# Patient Record
Sex: Female | Born: 1955
Health system: Southern US, Community
[De-identification: ages and names within clinical notes are randomized; demographics above are authoritative.]

## PROBLEM LIST (undated history)

## (undated) DIAGNOSIS — K5792 Diverticulitis of intestine, part unspecified, without perforation or abscess without bleeding: Secondary | ICD-10-CM

## (undated) HISTORY — PX: DILATION AND CURETTAGE OF UTERUS: SHX78

## (undated) HISTORY — DX: Diverticulitis of intestine, part unspecified, without perforation or abscess without bleeding: K57.92

---

## 2004-01-21 ENCOUNTER — Ambulatory Visit: Payer: Self-pay | Admitting: Unknown Physician Specialty

## 2004-02-19 ENCOUNTER — Ambulatory Visit: Payer: Self-pay | Admitting: Unknown Physician Specialty

## 2005-05-23 ENCOUNTER — Encounter: Payer: Self-pay | Admitting: Family Medicine

## 2006-06-07 ENCOUNTER — Ambulatory Visit: Payer: Self-pay | Admitting: Unknown Physician Specialty

## 2007-01-24 HISTORY — PX: OTHER SURGICAL HISTORY: SHX169

## 2007-07-16 ENCOUNTER — Ambulatory Visit: Payer: Self-pay | Admitting: Unknown Physician Specialty

## 2007-09-23 ENCOUNTER — Ambulatory Visit: Payer: Self-pay | Admitting: Unknown Physician Specialty

## 2010-03-07 ENCOUNTER — Ambulatory Visit: Payer: Self-pay

## 2010-03-09 ENCOUNTER — Ambulatory Visit: Payer: Self-pay

## 2012-02-26 ENCOUNTER — Other Ambulatory Visit: Payer: Self-pay | Admitting: Physician Assistant

## 2012-02-26 LAB — SEDIMENTATION RATE: Erythrocyte Sed Rate: 5 mm/h

## 2013-02-20 LAB — HM PAP SMEAR: HM Pap smear: NEGATIVE

## 2013-02-24 ENCOUNTER — Ambulatory Visit: Payer: Self-pay | Admitting: Obstetrics and Gynecology

## 2013-02-24 LAB — HM MAMMOGRAPHY

## 2015-01-07 ENCOUNTER — Ambulatory Visit: Payer: Self-pay | Admitting: Physician Assistant

## 2015-01-07 ENCOUNTER — Encounter: Payer: Self-pay | Admitting: Physician Assistant

## 2015-01-07 VITALS — BP 142/90 | HR 60 | Temp 98.1°F

## 2015-01-07 DIAGNOSIS — M25572 Pain in left ankle and joints of left foot: Secondary | ICD-10-CM

## 2015-01-07 NOTE — Progress Notes (Signed)
S: c/o left ankle pain and swelling, no known injury, pain increased with pointing and flexing to toe, swelling is to the back of the ankle, gets worse at the end of the day, noticed after she was riding a bike, sx for 2 months  O: vitals wnl, nad, left ankle neg for bony tenderness, no swelling noted, area behind ankle and adjacent to achilles tendon tender to palp, full rom, n/v intact  A: ankle pain  P: f/u with podiatry, use ice, nsaids, rest and elevation

## 2015-04-27 DIAGNOSIS — Z1239 Encounter for other screening for malignant neoplasm of breast: Secondary | ICD-10-CM | POA: Diagnosis not present

## 2015-04-27 DIAGNOSIS — Z01419 Encounter for gynecological examination (general) (routine) without abnormal findings: Secondary | ICD-10-CM | POA: Diagnosis not present

## 2015-12-06 ENCOUNTER — Ambulatory Visit: Payer: Self-pay | Admitting: Physician Assistant

## 2015-12-06 VITALS — BP 129/80 | HR 68 | Temp 98.0°F

## 2015-12-06 DIAGNOSIS — J029 Acute pharyngitis, unspecified: Secondary | ICD-10-CM

## 2015-12-06 NOTE — Progress Notes (Signed)
S/ ST, hurts to swallow x 5 days, without other acute sxs with ROS .   Nose closes up at night ,neg hx of allergies . + snores, wakes self up without day time fatigue  O/ VSS ENT pharynx injected without lesions ,exudate neck supple without nodes Heart rsr lungs clear A/ Sore throat   ? pnd , ? Secondary to snoring, ? Viral P / symptomatic measures encouraged.  Try otc claritin, nasal saline. And analgesics . F/u prn not improving.

## 2016-04-12 DIAGNOSIS — H52222 Regular astigmatism, left eye: Secondary | ICD-10-CM | POA: Diagnosis not present

## 2016-04-12 DIAGNOSIS — H524 Presbyopia: Secondary | ICD-10-CM | POA: Diagnosis not present

## 2016-04-12 DIAGNOSIS — H5203 Hypermetropia, bilateral: Secondary | ICD-10-CM | POA: Diagnosis not present

## 2016-05-04 ENCOUNTER — Ambulatory Visit (INDEPENDENT_AMBULATORY_CARE_PROVIDER_SITE_OTHER): Payer: 59 | Admitting: Obstetrics and Gynecology

## 2016-05-04 ENCOUNTER — Encounter: Payer: Self-pay | Admitting: Obstetrics and Gynecology

## 2016-05-04 VITALS — BP 120/80 | Ht 62.0 in | Wt 108.0 lb

## 2016-05-04 DIAGNOSIS — Z1231 Encounter for screening mammogram for malignant neoplasm of breast: Secondary | ICD-10-CM

## 2016-05-04 DIAGNOSIS — Z124 Encounter for screening for malignant neoplasm of cervix: Secondary | ICD-10-CM | POA: Diagnosis not present

## 2016-05-04 DIAGNOSIS — Z01419 Encounter for gynecological examination (general) (routine) without abnormal findings: Secondary | ICD-10-CM

## 2016-05-04 DIAGNOSIS — Z1239 Encounter for other screening for malignant neoplasm of breast: Secondary | ICD-10-CM

## 2016-05-04 DIAGNOSIS — Z0000 Encounter for general adult medical examination without abnormal findings: Secondary | ICD-10-CM

## 2016-05-04 DIAGNOSIS — Z1151 Encounter for screening for human papillomavirus (HPV): Secondary | ICD-10-CM | POA: Diagnosis not present

## 2016-05-04 DIAGNOSIS — Z13220 Encounter for screening for lipoid disorders: Secondary | ICD-10-CM

## 2016-05-04 DIAGNOSIS — Z1321 Encounter for screening for nutritional disorder: Secondary | ICD-10-CM

## 2016-05-04 NOTE — Progress Notes (Signed)
Chief Complaint  Patient presents with  . Gynecologic Exam    HPI:      Ms. Haley Cortez is a 61 y.o. G2P1011 who LMP was No LMP recorded. Patient is postmenopausal., presents today for her annual examination.  Her menses are absent. Dysmenorrhea none. She does not have intermenstrual bleeding.  She does not have vasomotor sx.  Sex activity: single partner, contraception - post menopausal status. She does have vaginal dryness. She uses lubricants with sx relief.  Last Pap: February 20, 2013  Results were: no abnormalities /neg HPV DNA.  Hx of STDs: none  Last mammogram: through work, at [facility] Results were: normal--routine follow-up in 12 months There is no FH of breast cancer. There is no FH of ovarian cancer. The patient does do self-breast exams.  Colonoscopy: colonoscopy 9 years ago without abnormalities.   Tobacco use: The patient denies current or previous tobacco use. Alcohol use: social drinker Exercise: moderately active  She does get adequate calcium and Vitamin D in her diet. She would like screening labs done. She no longer has PCP.  Past Medical History:  Diagnosis Date  . Diverticulitis     Past Surgical History:  Procedure Laterality Date  . diagnostic colonoscopy  2009   diverticulitis  . DILATION AND CURETTAGE OF UTERUS      Family History  Problem Relation Age of Onset  . Hypertension Mother     Social History   Social History  . Marital status: Married    Spouse name: N/A  . Number of children: N/A  . Years of education: N/A   Occupational History  . Not on file.   Social History Main Topics  . Smoking status: Never Smoker  . Smokeless tobacco: Never Used  . Alcohol use 0.0 oz/week  . Drug use: No  . Sexual activity: Yes   Other Topics Concern  . Not on file   Social History Narrative  . No narrative on file    No current outpatient prescriptions on file.   ROS:  Review of Systems  Constitutional: Negative for fever,  malaise/fatigue and weight loss.  HENT: Negative for congestion, ear pain and sinus pain.   Respiratory: Negative for cough, shortness of breath and wheezing.   Cardiovascular: Negative for chest pain, orthopnea and leg swelling.  Gastrointestinal: Negative for constipation, diarrhea, nausea and vomiting.  Genitourinary: Negative for dysuria, frequency, hematuria and urgency.       Breast ROS: negative   Musculoskeletal: Negative for back pain, joint pain and myalgias.  Skin: Negative for itching and rash.  Neurological: Negative for dizziness, tingling, focal weakness and headaches.  Endo/Heme/Allergies: Negative for environmental allergies. Does not bruise/bleed easily.  Psychiatric/Behavioral: Negative for depression and suicidal ideas. The patient is not nervous/anxious and does not have insomnia.     Objective: BP 120/80   Ht  (1.575 m)   Wt 108 lb (49 kg)   BMI 19.75 kg/m    Physical Exam  Constitutional: She is oriented to person, place, and time. She appears well-developed and well-nourished.  Genitourinary: Vagina normal and uterus normal. No erythema or tenderness in the vagina. No vaginal discharge found. Right adnexum does not display mass and does not display tenderness. Left adnexum does not display mass and does not display tenderness. Cervix does not exhibit motion tenderness or polyp. Uterus is not enlarged or tender.  Neck: Normal range of motion. No thyromegaly present.  Cardiovascular: Normal rate, regular rhythm and normal heart sounds.  No murmur heard. Pulmonary/Chest: Effort normal and breath sounds normal. Right breast exhibits no mass, no nipple discharge, no skin change and no tenderness. Left breast exhibits no mass, no nipple discharge, no skin change and no tenderness.  Abdominal: Soft. There is no tenderness. There is no guarding.  Musculoskeletal: Normal range of motion.  Neurological: She is alert and oriented to person, place, and time. No  cranial nerve deficit.  Psychiatric: She has a normal mood and affect. Her behavior is normal.  Vitals reviewed.    Assessment/Plan: Encounter for annual routine gynecological examination  Cervical cancer screening - Plan: IGP, Aptima HPV  Screening for HPV (human papillomavirus) - Plan: IGP, Aptima HPV  Screening for breast cancer - Plan: MM DIGITAL SCREENING BILATERAL  Blood tests for routine general physical examination - Plan: Comprehensive metabolic panel, Lipid panel, VITAMIN D 25 Hydroxy (Vit-D Deficiency, Fractures)  Screening cholesterol level - Plan: Lipid panel  Encounter for vitamin deficiency screening - Plan: VITAMIN D 25 Hydroxy (Vit-D Deficiency, Fractures)           GYN counsel mammography screening, adequate intake of calcium and vitamin D     F/U  Return in about 1 year (around 05/04/2017).  Alicia B. Copland, PA-C 05/04/2016 8:38 AM

## 2016-05-05 LAB — COMPREHENSIVE METABOLIC PANEL
ALBUMIN: 4.6 g/dL (ref 3.6–4.8)
ALK PHOS: 49 IU/L (ref 39–117)
ALT: 17 IU/L (ref 0–32)
AST: 17 IU/L (ref 0–40)
Albumin/Globulin Ratio: 1.8 (ref 1.2–2.2)
BILIRUBIN TOTAL: 0.3 mg/dL (ref 0.0–1.2)
BUN / CREAT RATIO: 19 (ref 12–28)
BUN: 13 mg/dL (ref 8–27)
CHLORIDE: 103 mmol/L (ref 96–106)
CO2: 27 mmol/L (ref 18–29)
Calcium: 9.5 mg/dL (ref 8.7–10.3)
Creatinine, Ser: 0.7 mg/dL (ref 0.57–1.00)
GFR calc Af Amer: 107 mL/min/{1.73_m2} (ref >59)
GFR calc non Af Amer: 93 mL/min/{1.73_m2} (ref >59)
GLUCOSE: 83 mg/dL (ref 65–99)
Globulin, Total: 2.6 g/dL (ref 1.5–4.5)
POTASSIUM: 4.1 mmol/L (ref 3.5–5.2)
SODIUM: 146 mmol/L — AB (ref 134–144)
Total Protein: 7.2 g/dL (ref 6.0–8.5)

## 2016-05-05 LAB — VITAMIN D 25 HYDROXY (VIT D DEFICIENCY, FRACTURES): Vit D, 25-Hydroxy: 38.7 ng/mL (ref 30.0–100.0)

## 2016-05-05 LAB — LIPID PANEL
CHOLESTEROL TOTAL: 200 mg/dL — AB (ref 100–199)
Chol/HDL Ratio: 3.1 ratio (ref 0.0–4.4)
HDL: 64 mg/dL (ref >39)
LDL Calculated: 119 mg/dL — ABNORMAL HIGH (ref 0–99)
Triglycerides: 83 mg/dL (ref 0–149)
VLDL CHOLESTEROL CAL: 17 mg/dL (ref 5–40)

## 2016-05-07 LAB — IGP, APTIMA HPV
HPV APTIMA: NEGATIVE
PAP SMEAR COMMENT: 0

## 2016-05-24 ENCOUNTER — Ambulatory Visit
Admission: RE | Admit: 2016-05-24 | Discharge: 2016-05-24 | Disposition: A | Discharge status: Home / Self Care | Payer: 59 | Source: Ambulatory Visit | Attending: Obstetrics and Gynecology | Admitting: Obstetrics and Gynecology

## 2016-05-24 ENCOUNTER — Encounter: Payer: Self-pay | Admitting: Obstetrics and Gynecology

## 2016-05-24 DIAGNOSIS — Z1231 Encounter for screening mammogram for malignant neoplasm of breast: Secondary | ICD-10-CM | POA: Diagnosis not present

## 2016-05-24 DIAGNOSIS — Z1239 Encounter for other screening for malignant neoplasm of breast: Secondary | ICD-10-CM

## 2017-06-22 ENCOUNTER — Other Ambulatory Visit: Payer: Self-pay | Admitting: Obstetrics and Gynecology

## 2017-07-18 ENCOUNTER — Encounter: Payer: Self-pay | Admitting: Obstetrics and Gynecology

## 2017-07-18 ENCOUNTER — Ambulatory Visit (INDEPENDENT_AMBULATORY_CARE_PROVIDER_SITE_OTHER): Payer: 59 | Admitting: Obstetrics and Gynecology

## 2017-07-18 VITALS — BP 130/80 | Ht 62.0 in | Wt 129.0 lb

## 2017-07-18 DIAGNOSIS — Z13220 Encounter for screening for lipoid disorders: Secondary | ICD-10-CM | POA: Diagnosis not present

## 2017-07-18 DIAGNOSIS — Z1211 Encounter for screening for malignant neoplasm of colon: Secondary | ICD-10-CM

## 2017-07-18 DIAGNOSIS — Z1231 Encounter for screening mammogram for malignant neoplasm of breast: Secondary | ICD-10-CM

## 2017-07-18 DIAGNOSIS — Z0000 Encounter for general adult medical examination without abnormal findings: Secondary | ICD-10-CM

## 2017-07-18 DIAGNOSIS — Z1239 Encounter for other screening for malignant neoplasm of breast: Secondary | ICD-10-CM

## 2017-07-18 DIAGNOSIS — Z01419 Encounter for gynecological examination (general) (routine) without abnormal findings: Secondary | ICD-10-CM | POA: Diagnosis not present

## 2017-07-18 NOTE — Progress Notes (Signed)
Chief Complaint  Patient presents with  . Gynecologic Exam     HPI:      Ms. Haley Cortez is a 62 y.o. G2P1011 who LMP was No LMP recorded. Patient is postmenopausal., presents today for her annual examination. Her menses are absent. Dysmenorrhea none. She does not have intermenstrual bleeding.  She does not have vasomotor sx.  Sex activity: single partner, contraception - post menopausal status. She does have vaginal dryness. She uses lubricants with sx relief.  Last Pap: 05/04/16  Results were: no abnormalities /neg HPV DNA.  Hx of STDs: none  Last mammogram: through work, 05/24/16. Results were: normal--routine follow-up in 12 months There is no FH of breast cancer. There is no FH of ovarian cancer. The patient does do self-breast exams.  Colonoscopy: colonoscopy 10 years ago with diverticulosis; repeat due after 10 yrs. Pt thinks it was with Dr. Mechele CollinElliott.   Tobacco use: The patient denies current or previous tobacco use. Alcohol use: social drinker Exercise: moderately active  She does get adequate calcium and Vitamin D in her diet.  Had borderline lipids 2018 labs. Due for repeat today.   Past Medical History:  Diagnosis Date  . Diverticulitis     Past Surgical History:  Procedure Laterality Date  . diagnostic colonoscopy  2009   diverticulitis  . DILATION AND CURETTAGE OF UTERUS      Family History  Problem Relation Age of Onset  . Hypertension Mother   . Breast cancer Neg Hx     Social History   Socioeconomic History  . Marital status: Married    Spouse name: Not on file  . Number of children: Not on file  . Years of education: Not on file  . Highest education level: Not on file  Occupational History  . Not on file  Social Needs  . Financial resource strain: Not on file  . Food insecurity:    Worry: Not on file    Inability: Not on file  . Transportation needs:    Medical: Not on file    Non-medical: Not on file  Tobacco Use  . Smoking  status: Never Smoker  . Smokeless tobacco: Never Used  Substance and Sexual Activity  . Alcohol use: Yes    Alcohol/week: 0.0 oz  . Drug use: No  . Sexual activity: Yes  Lifestyle  . Physical activity:    Days per week: Not on file    Minutes per session: Not on file  . Stress: Not on file  Relationships  . Social connections:    Talks on phone: Not on file    Gets together: Not on file    Attends religious service: Not on file    Active member of club or organization: Not on file    Attends meetings of clubs or organizations: Not on file    Relationship status: Not on file  . Intimate partner violence:    Fear of current or ex partner: Not on file    Emotionally abused: Not on file    Physically abused: Not on file    Forced sexual activity: Not on file  Other Topics Concern  . Not on file  Social History Narrative  . Not on file    No current outpatient medications on file prior to visit.   No current facility-administered medications on file prior to visit.       ROS:  Review of Systems  Constitutional: Negative for fatigue, fever and unexpected weight  change.  Respiratory: Negative for cough, shortness of breath and wheezing.   Cardiovascular: Negative for chest pain, palpitations and leg swelling.  Gastrointestinal: Negative for blood in stool, constipation, diarrhea, nausea and vomiting.  Endocrine: Negative for cold intolerance, heat intolerance and polyuria.  Genitourinary: Negative for dyspareunia, dysuria, flank pain, frequency, genital sores, hematuria, menstrual problem, pelvic pain, urgency, vaginal bleeding, vaginal discharge and vaginal pain.  Musculoskeletal: Negative for back pain, joint swelling and myalgias.  Skin: Negative for rash.  Neurological: Negative for dizziness, syncope, light-headedness, numbness and headaches.  Hematological: Negative for adenopathy.  Psychiatric/Behavioral: Negative for agitation, confusion, sleep disturbance and  suicidal ideas. The patient is not nervous/anxious.      Objective: BP 130/80   Ht 5\' 2"  (1.575 m)   Wt 129 lb (58.5 kg)   BMI 23.59 kg/m    Physical Exam  Constitutional: She is oriented to person, place, and time. She appears well-developed and well-nourished.  Genitourinary: Vagina normal and uterus normal. There is no rash or tenderness on the right labia. There is no rash or tenderness on the left labia. No erythema or tenderness in the vagina. No vaginal discharge found. Right adnexum does not display mass and does not display tenderness. Left adnexum does not display mass and does not display tenderness. Cervix does not exhibit motion tenderness or polyp. Uterus is not enlarged or tender.  Neck: Normal range of motion. No thyromegaly present.  Cardiovascular: Normal rate, regular rhythm and normal heart sounds.  No murmur heard. Pulmonary/Chest: Effort normal and breath sounds normal. Right breast exhibits no mass, no nipple discharge, no skin change and no tenderness. Left breast exhibits no mass, no nipple discharge, no skin change and no tenderness.  Abdominal: Soft. There is no tenderness. There is no guarding.  Musculoskeletal: Normal range of motion.  Neurological: She is alert and oriented to person, place, and time. No cranial nerve deficit.  Psychiatric: She has a normal mood and affect. Her behavior is normal.  Vitals reviewed.   Assessment/Plan: Encounter for annual routine gynecological examination  Screening for breast cancer - Pt to sched mammo - Plan: MM DIGITAL SCREENING BILATERAL  Screening for colon cancer - Pt to f/u with [facility] to sched colonoscopy. Will do ref prn.  Blood tests for routine general physical examination - Plan: Comprehensive metabolic panel, Lipid panel  Screening cholesterol level - Plan: Lipid panel          GYN counsel breast self exam, mammography screening, menopause, adequate intake of calcium and vitamin D, diet and exercise      F/U  Return in about 1 year (around 07/19/2018).  Vincent Ehrler B. Tahiry Spicer, PA-C 07/18/2017 8:28 AM

## 2017-07-18 NOTE — Patient Instructions (Signed)
I value your feedback and entrusting us with your care. If you get a [facility] patient survey, I would appreciate you taking the time to let us know about your experience today. Thank you! 

## 2017-07-19 LAB — COMPREHENSIVE METABOLIC PANEL
A/G RATIO: 2.1 (ref 1.2–2.2)
ALT: 12 IU/L (ref 0–32)
AST: 17 IU/L (ref 0–40)
Albumin: 4.5 g/dL (ref 3.6–4.8)
Alkaline Phosphatase: 46 IU/L (ref 39–117)
BILIRUBIN TOTAL: 0.3 mg/dL (ref 0.0–1.2)
BUN/Creatinine Ratio: 18 (ref 12–28)
BUN: 14 mg/dL (ref 8–27)
CO2: 24 mmol/L (ref 20–29)
Calcium: 9.6 mg/dL (ref 8.7–10.3)
Chloride: 104 mmol/L (ref 96–106)
Creatinine, Ser: 0.79 mg/dL (ref 0.57–1.00)
GFR calc non Af Amer: 79 mL/min/{1.73_m2} (ref >59)
GFR, EST AFRICAN AMERICAN: 91 mL/min/{1.73_m2} (ref >59)
Globulin, Total: 2.1 g/dL (ref 1.5–4.5)
Glucose: 84 mg/dL (ref 65–99)
POTASSIUM: 4.3 mmol/L (ref 3.5–5.2)
SODIUM: 142 mmol/L (ref 134–144)
TOTAL PROTEIN: 6.6 g/dL (ref 6.0–8.5)

## 2017-07-19 LAB — LIPID PANEL
CHOL/HDL RATIO: 2.5 ratio (ref 0.0–4.4)
CHOLESTEROL TOTAL: 195 mg/dL (ref 100–199)
HDL: 77 mg/dL (ref >39)
LDL Calculated: 104 mg/dL — ABNORMAL HIGH (ref 0–99)
Triglycerides: 70 mg/dL (ref 0–149)
VLDL Cholesterol Cal: 14 mg/dL (ref 5–40)

## 2017-08-06 ENCOUNTER — Encounter: Payer: Self-pay | Admitting: Obstetrics and Gynecology

## 2017-08-06 ENCOUNTER — Ambulatory Visit
Admission: RE | Admit: 2017-08-06 | Discharge: 2017-08-06 | Disposition: A | Discharge status: Home / Self Care | Payer: 59 | Source: Ambulatory Visit | Attending: Obstetrics and Gynecology | Admitting: Obstetrics and Gynecology

## 2017-08-06 DIAGNOSIS — Z1231 Encounter for screening mammogram for malignant neoplasm of breast: Secondary | ICD-10-CM | POA: Insufficient documentation

## 2017-08-06 DIAGNOSIS — Z1239 Encounter for other screening for malignant neoplasm of breast: Secondary | ICD-10-CM

## 2017-09-11 DIAGNOSIS — Z1211 Encounter for screening for malignant neoplasm of colon: Secondary | ICD-10-CM | POA: Diagnosis not present

## 2017-11-29 ENCOUNTER — Encounter: Payer: Self-pay | Admitting: Emergency Medicine

## 2017-11-30 ENCOUNTER — Ambulatory Visit
Admission: RE | Admit: 2017-11-30 | Discharge: 2017-11-30 | Disposition: A | Discharge status: Home / Self Care | Payer: 59 | Source: Ambulatory Visit | Attending: Unknown Physician Specialty | Admitting: Unknown Physician Specialty

## 2017-11-30 ENCOUNTER — Encounter
Admission: RE | Disposition: A | Discharge status: Home / Self Care | Payer: Self-pay | Source: Ambulatory Visit | Attending: Unknown Physician Specialty

## 2017-11-30 ENCOUNTER — Ambulatory Visit: Payer: 59 | Admitting: Anesthesiology

## 2017-11-30 ENCOUNTER — Encounter: Payer: Self-pay | Admitting: Anesthesiology

## 2017-11-30 DIAGNOSIS — K640 First degree hemorrhoids: Secondary | ICD-10-CM | POA: Diagnosis not present

## 2017-11-30 DIAGNOSIS — K5790 Diverticulosis of intestine, part unspecified, without perforation or abscess without bleeding: Secondary | ICD-10-CM | POA: Diagnosis not present

## 2017-11-30 DIAGNOSIS — Z8249 Family history of ischemic heart disease and other diseases of the circulatory system: Secondary | ICD-10-CM | POA: Insufficient documentation

## 2017-11-30 DIAGNOSIS — Z1211 Encounter for screening for malignant neoplasm of colon: Secondary | ICD-10-CM | POA: Diagnosis not present

## 2017-11-30 DIAGNOSIS — K648 Other hemorrhoids: Secondary | ICD-10-CM | POA: Diagnosis not present

## 2017-11-30 DIAGNOSIS — Z79899 Other long term (current) drug therapy: Secondary | ICD-10-CM | POA: Insufficient documentation

## 2017-11-30 DIAGNOSIS — K5730 Diverticulosis of large intestine without perforation or abscess without bleeding: Secondary | ICD-10-CM | POA: Insufficient documentation

## 2017-11-30 HISTORY — PX: COLONOSCOPY WITH PROPOFOL: SHX5780

## 2017-11-30 SURGERY — COLONOSCOPY WITH PROPOFOL
Anesthesia: General

## 2017-11-30 MED ORDER — FENTANYL CITRATE (PF) 100 MCG/2ML IJ SOLN
INTRAMUSCULAR | Status: DC | PRN
  Administered 2017-11-30 (×2): 50 ug via INTRAVENOUS

## 2017-11-30 MED ORDER — PROPOFOL 500 MG/50ML IV EMUL
INTRAVENOUS | Status: DC | PRN
  Administered 2017-11-30: 50 ug/kg/min via INTRAVENOUS

## 2017-11-30 MED ORDER — FENTANYL CITRATE (PF) 100 MCG/2ML IJ SOLN
INTRAMUSCULAR | Status: AC
  Filled 2017-11-30: qty 2

## 2017-11-30 MED ORDER — PROPOFOL 10 MG/ML IV BOLUS
INTRAVENOUS | Status: DC | PRN
  Administered 2017-11-30: 20 mg via INTRAVENOUS
  Administered 2017-11-30: 30 mg via INTRAVENOUS

## 2017-11-30 MED ORDER — SODIUM CHLORIDE 0.9 % IV SOLN: INTRAVENOUS | Status: DC

## 2017-11-30 MED ORDER — MIDAZOLAM HCL 5 MG/5ML IJ SOLN
INTRAMUSCULAR | Status: DC | PRN
  Administered 2017-11-30: 2 mg via INTRAVENOUS

## 2017-11-30 MED ORDER — LIDOCAINE HCL (PF) 2 % IJ SOLN
INTRAMUSCULAR | Status: DC | PRN
  Administered 2017-11-30: 60 mg

## 2017-11-30 MED ORDER — LIDOCAINE HCL (PF) 2 % IJ SOLN
INTRAMUSCULAR | Status: AC
  Filled 2017-11-30: qty 10

## 2017-11-30 MED ORDER — MIDAZOLAM HCL 2 MG/2ML IJ SOLN
INTRAMUSCULAR | Status: AC
  Filled 2017-11-30: qty 2

## 2017-11-30 MED ORDER — SODIUM CHLORIDE 0.9 % IV SOLN
INTRAVENOUS | Status: DC
  Administered 2017-11-30: 1000 mL via INTRAVENOUS

## 2017-11-30 NOTE — Op Note (Signed)
[facility] Gastroenterology Patient Name: Madeeha Costantino Procedure Date: 11/30/2017 8:42 AM MRN: [id number] Account #: [id number] Date of Birth: 1955/11/18 Admit Type: Outpatient Age: 62 Room: [facility] ENDO ROOM 1 Gender: Female Note Status: Finalized Procedure:            Colonoscopy Indications:          Screening for colorectal malignant neoplasm Providers:            Scot Jun, MD Referring MD:         No Local Md, MD (Referring MD) Medicines:            Propofol per Anesthesia Complications:        No immediate complications. Procedure:            Pre-Anesthesia Assessment:                       - After reviewing the risks and benefits, the patient                        was deemed in satisfactory condition to undergo the                        procedure.                       After obtaining informed consent, the colonoscope was                        passed under direct vision. Throughout the procedure,                        the patient's blood pressure, pulse, and oxygen                        saturations were monitored continuously. The                        Colonoscope was introduced through the anus and                        advanced to the the cecum, identified by appendiceal                        orifice and ileocecal valve. The colonoscopy was                        performed without difficulty. The patient tolerated the                        procedure well. The quality of the bowel preparation                        was excellent. Findings:      Multiple medium-mouthed diverticula were found in the sigmoid colon,       descending colon and transverse colon.      Internal hemorrhoids were found during endoscopy. The hemorrhoids were       small and Grade I (internal hemorrhoids that do not prolapse).      The exam was otherwise without abnormality. No polyps seen. Impression:           -  Diverticulosis in the sigmoid colon, in the              descending colon and in the transverse colon.                       - Internal hemorrhoids.                       - The examination was otherwise normal.                       - No specimens collected. Recommendation:       - Await pathology results. Scot Jun, MD 11/30/2017 9:19:45 AM This report has been signed electronically. Number of Addenda: 0 Note Initiated On: 11/30/2017 8:42 AM Scope Withdrawal Time: 0 hours 7 minutes 39 seconds  Total Procedure Duration: 0 hours 15 minutes 57 seconds       [facility]

## 2017-11-30 NOTE — Anesthesia Post-op Follow-up Note (Signed)
Anesthesia QCDR form completed.        

## 2017-11-30 NOTE — H&P (Signed)
Primary Care Physician:  Patient, No Pcp Per Primary Gastroenterologist:  Dr. Mechele Collin  Pre-Procedure History & Physical: HPI:  Haley Cortez is a 62 y.o. female is here for an colonoscopy.   Past Medical History:  Diagnosis Date  . Diverticulitis     Past Surgical History:  Procedure Laterality Date  . diagnostic colonoscopy  2009   diverticulitis  . DILATION AND CURETTAGE OF UTERUS      Prior to Admission medications   Medication Sig Start Date End Date Taking? Authorizing Provider  Calcium Carbonate-Vitamin D (CALTRATE 600+D PO) Take by mouth.   Yes [provider]    Allergies as of 09/11/2017  . (No Known Allergies)    Family History  Problem Relation Age of Onset  . Hypertension Mother   . Breast cancer Neg Hx     Social History   Socioeconomic History  . Marital status: Married    Spouse name: Not on file  . Number of children: Not on file  . Years of education: Not on file  . Highest education level: Not on file  Occupational History  . Not on file  Social Needs  . Financial resource strain: Not on file  . Food insecurity:    Worry: Not on file    Inability: Not on file  . Transportation needs:    Medical: Not on file    Non-medical: Not on file  Tobacco Use  . Smoking status: Never Smoker  . Smokeless tobacco: Never Used  Substance and Sexual Activity  . Alcohol use: Yes    Alcohol/week: 0.0 standard drinks  . Drug use: No  . Sexual activity: Yes  Lifestyle  . Physical activity:    Days per week: Not on file    Minutes per session: Not on file  . Stress: Not on file  Relationships  . Social connections:    Talks on phone: Not on file    Gets together: Not on file    Attends religious service: Not on file    Active member of club or organization: Not on file    Attends meetings of clubs or organizations: Not on file    Relationship status: Not on file  . Intimate partner violence:    Fear of current or ex partner: Not on file     Emotionally abused: Not on file    Physically abused: Not on file    Forced sexual activity: Not on file  Other Topics Concern  . Not on file  Social History Narrative  . Not on file    Review of Systems: See HPI, otherwise negative ROS  Physical Exam: BP (!) 178/101   Pulse 72   Temp (!) 97.4 F (36.3 C) (Tympanic)   Resp 20   Ht 5\' 2"  (1.575 m)   Wt 59 kg   SpO2 100%   BMI 23.78 kg/m  General:   Alert,  pleasant and cooperative in NAD Head:  Normocephalic and atraumatic. Neck:  Supple; no masses or thyromegaly. Lungs:  Clear throughout to auscultation.    Heart:  Regular rate and rhythm. Abdomen:  Soft, nontender and nondistended. Normal bowel sounds, without guarding, and without rebound.   Neurologic:  Alert and  oriented x4;  grossly normal neurologically.  Impression/Plan: Haley Cortez is here for an colonoscopy to be performed for colon cancer screening.  Last colon exam was 10 years ago.  Risks, benefits, limitations, and alternatives regarding  colonoscopy have been reviewed with the  patient.  Questions have been answered.  All parties agreeable.   Lynnae Prude, MD  11/30/2017, 8:53 AM

## 2017-11-30 NOTE — Anesthesia Preprocedure Evaluation (Signed)
Anesthesia Evaluation  Patient identified by MRN, date of birth, ID band Patient awake    Reviewed: Allergy & Precautions, NPO status , Patient's Chart, lab work & pertinent test results, reviewed documented beta blocker date and time   Airway Mallampati: II  TM Distance: >3 FB     Dental  (+) Chipped   Pulmonary           Cardiovascular      Neuro/Psych    GI/Hepatic   Endo/Other    Renal/GU      Musculoskeletal   Abdominal   Peds  Hematology   Anesthesia Other Findings   Reproductive/Obstetrics                             Anesthesia Physical Anesthesia Plan  ASA: II  Anesthesia Plan: General   Post-op Pain Management:    Induction: Intravenous  PONV Risk Score and Plan:   Airway Management Planned:   Additional Equipment:   Intra-op Plan:   Post-operative Plan:   Informed Consent: I have reviewed the patients History and Physical, chart, labs and discussed the procedure including the risks, benefits and alternatives for the proposed anesthesia with the patient or authorized representative who has indicated his/her understanding and acceptance.     Plan Discussed with: CRNA  Anesthesia Plan Comments:         Anesthesia Quick Evaluation  

## 2017-11-30 NOTE — Transfer of Care (Signed)
Immediate Anesthesia Transfer of Care Note  Patient: Haley Cortez  Procedure(s) Performed: COLONOSCOPY WITH PROPOFOL (N/A )  Patient Location: PACU  Anesthesia Type:General  Level of Consciousness: sedated  Airway & Oxygen Therapy: Patient Spontanous Breathing and Patient connected to nasal cannula oxygen  Post-op Assessment: Report given to RN and Post -op Vital signs reviewed and stable  Post vital signs: Reviewed and stable  Last Vitals:  Vitals Value Taken Time  BP 110/62 11/30/2017  9:19 AM  Temp 36.2 C 11/30/2017  9:19 AM  Pulse 66 11/30/2017  9:20 AM  Resp 15 11/30/2017  9:20 AM  SpO2 100 % 11/30/2017  9:20 AM  Vitals shown include unvalidated device data.  Last Pain:  Vitals:   11/30/17 0919  TempSrc: Tympanic  PainSc: Asleep         Complications: No apparent anesthesia complications

## 2017-11-30 NOTE — Anesthesia Postprocedure Evaluation (Signed)
Anesthesia Post Note  Patient: Haley Cortez  Procedure(s) Performed: COLONOSCOPY WITH PROPOFOL (N/A )  Patient location during evaluation: Endoscopy Anesthesia Type: General Level of consciousness: awake and alert Pain management: pain level controlled Vital Signs Assessment: post-procedure vital signs reviewed and stable Respiratory status: spontaneous breathing, nonlabored ventilation, respiratory function stable and patient connected to nasal cannula oxygen Cardiovascular status: blood pressure returned to baseline and stable Postop Assessment: no apparent nausea or vomiting Anesthetic complications: no     Last Vitals:  Vitals:   11/30/17 0929 11/30/17 0949  BP: 126/69 (!) 153/79  Pulse:    Resp:    Temp:    SpO2:      Last Pain:  Vitals:   11/30/17 0949  TempSrc:   PainSc: 0-No pain                 Chadley Dziedzic S

## 2017-12-03 ENCOUNTER — Encounter: Payer: Self-pay | Admitting: Unknown Physician Specialty

## 2018-02-20 ENCOUNTER — Encounter: Payer: Self-pay | Admitting: Physician Assistant

## 2018-02-20 ENCOUNTER — Ambulatory Visit: Payer: Self-pay | Admitting: Physician Assistant

## 2018-02-20 VITALS — BP 152/100 | HR 70 | Temp 98.4°F | Resp 20 | Ht 62.0 in | Wt 136.0 lb

## 2018-02-20 DIAGNOSIS — J069 Acute upper respiratory infection, unspecified: Secondary | ICD-10-CM

## 2018-02-20 DIAGNOSIS — R030 Elevated blood-pressure reading, without diagnosis of hypertension: Secondary | ICD-10-CM

## 2018-02-20 MED ORDER — CETIRIZINE HCL 10 MG PO TABS: 10.0000 mg | ORAL_TABLET | Freq: Every day | ORAL | 0 refills | Status: DC

## 2018-02-20 MED ORDER — FLUTICASONE PROPIONATE 50 MCG/ACT NA SUSP: 2.0000 | Freq: Every day | NASAL | 0 refills | Status: DC

## 2018-02-20 MED ORDER — DM-DOXYLAMINE-ACETAMINOPHEN 15-6.25-325 MG/15ML PO LIQD: 30.0000 mL | Freq: Every evening | ORAL | 0 refills | Status: DC | PRN

## 2018-02-20 MED ORDER — SALINE SPRAY 0.65 % NA SOLN: 1.0000 | NASAL | 0 refills | Status: DC | PRN

## 2018-02-20 NOTE — Progress Notes (Signed)
MRN: [id number] DOB: 11/13/1955  Subjective:   Haley Cortez is a 63 y.o. female presenting for chief complaint of Sore Throat (x 4d) and Nasal Congestion (x 4d) .  Scratchy throat , nasal congestion, and sinus congestion x 4 days. What she was able to blow out today had a little green color to it. Has decreased taste. Has coughed a few times, but it has been dry. Feels most congested at night time. Denies fever, body aches,  sinus headache, inability to swallow, productive cough, wheezing, shortness of breath and chest pain, night sweats, nausea, vomiting, abdominal pain and diarrhea. Has not tried anything for relief. No known sick contact exposure. Has PMH of seasonal allergies. Denies hx of asthma, COPD, DM, HTN. Had flu shot this season. Denies smoking. Denies any other aggravating or relieving factors, no other questions or concerns.  Review of Systems  Constitutional: Negative for diaphoresis.  HENT: Negative for ear pain.   Eyes: Negative for blurred vision and double vision.  Respiratory: Negative for hemoptysis, sputum production and shortness of breath.   Cardiovascular: Negative for chest pain and leg swelling.  Genitourinary: Negative for hematuria.  Musculoskeletal: Negative for neck pain.  Skin: Negative for rash.  Neurological: Negative for dizziness and weakness.    Haley Cortez has a current medication list which includes the following prescription(s): calcium carbonate-vitamin d. Also has No Known Allergies.  Haley Cortez  has a past medical history of Diverticulitis. Also  has a past surgical history that includes Dilation and curettage of uterus; diagnostic colonoscopy (2009); and Colonoscopy with propofol (N/A, 11/30/2017).   Objective:   Vitals: BP (!) 140/100 (BP Location: Right Arm, Patient Position: Sitting, Cuff Size: Normal)   Pulse 70   Temp 98.4 F (36.9 C)   Resp 20   Ht 5\' 2"  (1.575 m)   Wt 136 lb (61.7 kg)   SpO2 98%   BMI 24.87 kg/m   Physical Exam Vitals signs  reviewed.  Constitutional:      General: She is not in acute distress.    Appearance: Normal appearance. She is well-developed and well-groomed. She is not ill-appearing or toxic-appearing.  HENT:     Head: Normocephalic and atraumatic.     Right Ear: Tympanic membrane, ear canal and external ear normal.     Left Ear: Tympanic membrane, ear canal and external ear normal.     Nose: Mucosal edema and congestion present.     Right Sinus: No maxillary sinus tenderness or frontal sinus tenderness.     Left Sinus: No maxillary sinus tenderness or frontal sinus tenderness.     Mouth/Throat:     Lips: Pink.     Mouth: Mucous membranes are moist.     Pharynx: Uvula midline. Posterior oropharyngeal erythema present.     Tonsils: No tonsillar exudate or tonsillar abscesses. Swelling: 1+ on the right. 1+ on the left.  Eyes:     Extraocular Movements: Extraocular movements intact.     Conjunctiva/sclera: Conjunctivae normal.     Pupils: Pupils are equal, round, and reactive to light.  Neck:     Musculoskeletal: Normal range of motion.  Cardiovascular:     Rate and Rhythm: Normal rate and regular rhythm.     Heart sounds: Normal heart sounds.  Pulmonary:     Effort: Pulmonary effort is normal.     Breath sounds: Normal breath sounds. No decreased breath sounds, wheezing, rhonchi or rales.  Lymphadenopathy:     Head:     Right side of  head: No submental, submandibular, tonsillar, preauricular, posterior auricular or occipital adenopathy.     Left side of head: No submental, submandibular, tonsillar, preauricular, posterior auricular or occipital adenopathy.     Cervical: No cervical adenopathy.     Upper Body:     Right upper body: No supraclavicular adenopathy.     Left upper body: No supraclavicular adenopathy.  Skin:    General: Skin is warm and dry.  Neurological:     Mental Status: She is alert.     No results found for this or any previous visit (from the past 24 hour(s)).  BP  Readings from Last 3 Encounters:  02/20/18 (!) 140/100  11/30/17 (!) 153/79  07/18/17 130/80    Assessment and Plan :  1. Viral URI Patient is overall well-appearing, no acute distress.  VSS. History and physical exam are consistent with viral URI.  Given educational material on viral URI.  Recommend symptomatic treatment at this time.  Advised to contact office if sinus pressure persists/worsens over the next 3 to 5 days, would consider Rx for antibiotic at that time.  Advised to follow-up in office, with family doctor, or local urgent care if no improvement in symptoms after 7 to 10 days.  Seek care sooner at local urgent care or ED if symptoms worsen/develop new concerning symptoms.  Patient voices understanding. - fluticasone (FLONASE) 50 MCG/ACT nasal spray; Place 2 sprays into both nostrils daily.  Dispense: 16 g; Refill: 0 - DM-Doxylamine-Acetaminophen (NYQUIL HBP COLD & FLU) 15-6.25-325 MG/15ML LIQD; Take 30 mLs by mouth at bedtime as needed.  Dispense: 120 mL; Refill: 0 - sodium chloride (OCEAN) 0.65 % SOLN nasal spray; Place 1 spray into both nostrils as needed for congestion.  Dispense: 60 mL; Refill: 0 - cetirizine (ZYRTEC) 10 MG tablet; Take 1 tablet (10 mg total) by mouth daily.  Dispense: 30 tablet; Refill: 0  2. Elevated BP without diagnosis of hypertension In terms of bp, she is asx.  She has been checking bp regularly at home and it has been WNL. Instructed to continue to do so over the next couple of weeks. Contact family doctor for appointment if consistently >140/90. Given strict ED precautions.    Benjiman Core, Cordelia Poche  [facility] 02/20/2018 4:36 PM

## 2018-02-20 NOTE — Patient Instructions (Signed)
Viral Respiratory Infection  Use flonase and zyrtec in the morning.  Nasal saline rinses before bedtime.  May use nyquil syrup before sleep.  Warm tea with honey and lemon for scratchy throat. If no improvement in sinus congestion in 3-5 days, please contact our office as you may need an antibiotic at that time.  If any symptoms worsen or you develop new concerning symptoms, please follow up with family doctor or local urgent care/ED.   In terms of elevated blood pressure, I would like you to check your blood pressure at least a couple times over the next week outside of the office and document these values. It is best if you check the blood pressure at different times in the day. Your goal is <140/90. If your values are consistently above this goal, please contact your family doctor. If you start to have chest pain, blurred vision, shortness of breath, severe headache, lower leg swelling, or nausea/vomiting please seek care immediately at ED.   A viral respiratory infection is an illness that affects parts of the body that are used for breathing. These include the lungs, nose, and throat. It is caused by a germ called a virus. Some examples of this kind of infection are:  A cold.  The flu (influenza).  A respiratory syncytial virus (RSV) infection. A person who gets this illness may have the following symptoms:  A stuffy or runny nose.  Yellow or green fluid in the nose.  A cough.  Sneezing.  Tiredness (fatigue).  Achy muscles.  A sore throat.  Sweating or chills.  A fever.  A headache. Follow these instructions at home: Managing pain and congestion  Take over-the-counter and prescription medicines only as told by your doctor.  If you have a sore throat, gargle with salt water. Do this 3-4 times per day or as needed. To make a salt-water mixture, dissolve -1 tsp of salt in 1 cup of warm water. Make sure that all the salt dissolves.  Use nose drops made from salt  water. This helps with stuffiness (congestion). It also helps soften the skin around your nose.  Drink enough fluid to keep your pee (urine) pale yellow. General instructions   Rest as much as possible.  Do not drink alcohol.  Do not use any products that have nicotine or tobacco, such as cigarettes and e-cigarettes. If you need help quitting, ask your doctor.  Keep all follow-up visits as told by your doctor. This is important. How is this prevented?   Get a flu shot every year. Ask your doctor when you should get your flu shot.  Do not let other people get your germs. If you are sick: ? Stay home from work or school. ? Wash your hands with soap and water often. Wash your hands after you cough or sneeze. If soap and water are not available, use hand sanitizer.  Avoid contact with people who are sick during cold and flu season. This is in fall and winter. Get help if:  Your symptoms last for 10 days or longer.  Your symptoms get worse over time.  You have a fever.  You have very bad pain in your face or forehead.  Parts of your jaw or neck become very swollen. Get help right away if:  You feel pain or pressure in your chest.  You have shortness of breath.  You faint or feel like you will faint.  You keep throwing up (vomiting).  You feel confused. Summary  A  viral respiratory infection is an illness that affects parts of the body that are used for breathing.  Examples of this illness include a cold, the flu, and respiratory syncytial virus (RSV) infection.  The infection can cause a runny nose, cough, sneezing, sore throat, and fever.  Follow what your doctor tells you about taking medicines, drinking lots of fluid, washing your hands, resting at home, and avoiding people who are sick. This information is not intended to replace advice given to you by your health care provider. Make sure you discuss any questions you have with your health care provider. Document  Released: 12/23/2007 Document Revised: 02/19/2017 Document Reviewed: 02/19/2017 Elsevier Interactive Patient Education  2019 [employer]

## 2018-02-22 ENCOUNTER — Telehealth: Payer: Self-pay | Admitting: Emergency Medicine

## 2018-02-22 NOTE — Telephone Encounter (Signed)
Left message following up on [facility] visit

## 2018-02-28 ENCOUNTER — Telehealth: Payer: Self-pay | Admitting: General Practice

## 2018-02-28 NOTE — Telephone Encounter (Signed)
[name] has referrred Haley Cortez.  Needing to verify that this person can become a pt of [employer].  Thanks, [employer]

## 2018-02-28 NOTE — Telephone Encounter (Signed)
Yes! This is okay per Antony ContrasJenni.

## 2018-03-04 DIAGNOSIS — H5203 Hypermetropia, bilateral: Secondary | ICD-10-CM | POA: Diagnosis not present

## 2018-03-04 DIAGNOSIS — H524 Presbyopia: Secondary | ICD-10-CM | POA: Diagnosis not present

## 2018-03-04 DIAGNOSIS — H52222 Regular astigmatism, left eye: Secondary | ICD-10-CM | POA: Diagnosis not present

## 2018-09-23 ENCOUNTER — Telehealth: Payer: Self-pay

## 2018-09-23 ENCOUNTER — Other Ambulatory Visit: Payer: Self-pay | Admitting: Obstetrics and Gynecology

## 2018-09-23 DIAGNOSIS — Z1239 Encounter for other screening for malignant neoplasm of breast: Secondary | ICD-10-CM

## 2018-09-23 NOTE — Telephone Encounter (Signed)
Called pt, no answer, left detailed msg

## 2018-09-23 NOTE — Telephone Encounter (Signed)
Pt has apt for AE w/[facility] on 10/02/2018. She is trying to schedule her mammogram & needs an order put in. [phone]

## 2018-09-23 NOTE — Telephone Encounter (Signed)
Pls let pt know order placed. She can call to sched.

## 2018-09-23 NOTE — Progress Notes (Signed)
3D mammo order placed per pt request

## 2018-10-01 NOTE — Progress Notes (Signed)
Chief Complaint  Patient presents with  . Gynecologic Exam     HPI:      Ms. CORDA LEIBER is a 63 y.o. G2P1011 who LMP was No LMP recorded. Patient is postmenopausal., presents today for her annual examination. Her menses are absent due to menopause. Pelvic pain none. She does not have intermenstrual bleeding.  She does not have vasomotor sx.   Sex activity: single partner, contraception - post menopausal status. She does have vaginal dryness. She uses lubricants with sx relief.  Last Pap: 05/04/16  Results were: no abnormalities /neg HPV DNA.  Hx of STDs: none  Last mammogram: through work, 08/06/17. Results were: normal--routine follow-up in 12 months. Has appt 10/20. There is no FH of breast cancer. There is no FH of ovarian cancer. The patient does do self-breast exams.  Colonoscopy: colonoscopy 11/2017 with diverticulosis in past; repeat due after 10 yrs. Dr. Mechele Collin.   Tobacco use: The patient denies current or previous tobacco use. Alcohol use: social drinker No drug use. Exercise: moderately active  She does get adequate calcium and Vitamin D in her diet.  Had borderline lipids 2018/2019 labs. Due for repeat today.   Past Medical History:  Diagnosis Date  . Diverticulitis     Past Surgical History:  Procedure Laterality Date  . COLONOSCOPY WITH PROPOFOL N/A 11/30/2017   Procedure: COLONOSCOPY WITH PROPOFOL;  Surgeon: Scot Jun, MD;  Location: [facility] ENDOSCOPY;  Service: Endoscopy;  Laterality: N/A;  . diagnostic colonoscopy  2009   diverticulitis  . DILATION AND CURETTAGE OF UTERUS      Family History  Problem Relation Age of Onset  . Hypertension Mother   . Breast cancer Neg Hx     Social History   Socioeconomic History  . Marital status: Married    Spouse name: Not on file  . Number of children: Not on file  . Years of education: Not on file  . Highest education level: Not on file  Occupational History  . Not on file  Social Needs  .  Financial resource strain: Not on file  . Food insecurity    Worry: Not on file    Inability: Not on file  . Transportation needs    Medical: Not on file    Non-medical: Not on file  Tobacco Use  . Smoking status: Never Smoker  . Smokeless tobacco: Never Used  Substance and Sexual Activity  . Alcohol use: Yes    Alcohol/week: 0.0 standard drinks  . Drug use: No  . Sexual activity: Yes    Birth control/protection: Post-menopausal  Lifestyle  . Physical activity    Days per week: Not on file    Minutes per session: Not on file  . Stress: Not on file  Relationships  . Social Musician on phone: Not on file    Gets together: Not on file    Attends religious service: Not on file    Active member of club or organization: Not on file    Attends meetings of clubs or organizations: Not on file    Relationship status: Not on file  . Intimate partner violence    Fear of current or ex partner: Not on file    Emotionally abused: Not on file    Physically abused: Not on file    Forced sexual activity: Not on file  Other Topics Concern  . Not on file  Social History Narrative  . Not on file  Current Outpatient Medications on File Prior to Visit  Medication Sig Dispense Refill  . Calcium Carbonate-Vitamin D (CALTRATE 600+D PO) Take by mouth.    . cetirizine (ZYRTEC) 10 MG tablet Take 1 tablet (10 mg total) by mouth daily. 30 tablet 0  . DM-Doxylamine-Acetaminophen (NYQUIL HBP COLD & FLU) 15-6.25-325 MG/15ML LIQD Take 30 mLs by mouth at bedtime as needed. 120 mL 0  . fluticasone (FLONASE) 50 MCG/ACT nasal spray Place 2 sprays into both nostrils daily. 16 g 0  . sodium chloride (OCEAN) 0.65 % SOLN nasal spray Place 1 spray into both nostrils as needed for congestion. 60 mL 0   No current facility-administered medications on file prior to visit.       ROS:  Review of Systems  Constitutional: Negative for fatigue, fever and unexpected weight change.  Respiratory:  Negative for cough, shortness of breath and wheezing.   Cardiovascular: Negative for chest pain, palpitations and leg swelling.  Gastrointestinal: Negative for blood in stool, constipation, diarrhea, nausea and vomiting.  Endocrine: Negative for cold intolerance, heat intolerance and polyuria.  Genitourinary: Negative for dyspareunia, dysuria, flank pain, frequency, genital sores, hematuria, menstrual problem, pelvic pain, urgency, vaginal bleeding, vaginal discharge and vaginal pain.  Musculoskeletal: Negative for back pain, joint swelling and myalgias.  Skin: Negative for rash.  Neurological: Negative for dizziness, syncope, light-headedness, numbness and headaches.  Hematological: Negative for adenopathy.  Psychiatric/Behavioral: Negative for agitation, confusion, sleep disturbance and suicidal ideas. The patient is not nervous/anxious.      Objective: BP 140/80   Ht 5\' 2"  (1.575 m)   Wt 139 lb (63 kg)   BMI 25.42 kg/m    Physical Exam Constitutional:      Appearance: She is well-developed.  Genitourinary:     Vulva, vagina, uterus, right adnexa and left adnexa normal.     No vulval lesion or tenderness noted.     No vaginal discharge, erythema or tenderness.     No cervical motion tenderness or polyp.     Uterus is not enlarged or tender.     No right or left adnexal mass present.     Right adnexa not tender.     Left adnexa not tender.  Neck:     Musculoskeletal: Normal range of motion.     Thyroid: No thyromegaly.  Cardiovascular:     Rate and Rhythm: Normal rate and regular rhythm.     Heart sounds: Normal heart sounds. No murmur.  Pulmonary:     Effort: Pulmonary effort is normal.     Breath sounds: Normal breath sounds.  Chest:     Breasts:        Right: No mass, nipple discharge, skin change or tenderness.        Left: No mass, nipple discharge, skin change or tenderness.  Abdominal:     Palpations: Abdomen is soft.     Tenderness: There is no abdominal  tenderness. There is no guarding.  Musculoskeletal: Normal range of motion.  Neurological:     General: No focal deficit present.     Mental Status: She is alert and oriented to person, place, and time.     Cranial Nerves: No cranial nerve deficit.  Skin:    General: Skin is warm and dry.  Psychiatric:        Mood and Affect: Mood normal.        Behavior: Behavior normal.        Thought Content: Thought content normal.  Judgment: Judgment normal.  Vitals signs reviewed.     Assessment/Plan: Encounter for annual routine gynecological examination  Screening for breast cancer--pt has mammo sched  Blood tests for routine general physical examination - Plan: Comprehensive metabolic panel, Lipid panel  Screening cholesterol level - Plan: Lipid panel          GYN counsel breast self exam, mammography screening, menopause, adequate intake of calcium and vitamin D, diet and exercise     F/U  Return in about 1 year (around 10/02/2019).  Alicia B. Copland, PA-C 10/02/2018 8:23 AM

## 2018-10-02 ENCOUNTER — Encounter: Payer: Self-pay | Admitting: Obstetrics and Gynecology

## 2018-10-02 ENCOUNTER — Ambulatory Visit (INDEPENDENT_AMBULATORY_CARE_PROVIDER_SITE_OTHER): Payer: 59 | Admitting: Obstetrics and Gynecology

## 2018-10-02 ENCOUNTER — Other Ambulatory Visit: Payer: Self-pay

## 2018-10-02 VITALS — BP 140/80 | Ht 62.0 in | Wt 139.0 lb

## 2018-10-02 DIAGNOSIS — Z1239 Encounter for other screening for malignant neoplasm of breast: Secondary | ICD-10-CM

## 2018-10-02 DIAGNOSIS — Z13220 Encounter for screening for lipoid disorders: Secondary | ICD-10-CM | POA: Diagnosis not present

## 2018-10-02 DIAGNOSIS — Z0000 Encounter for general adult medical examination without abnormal findings: Secondary | ICD-10-CM

## 2018-10-02 DIAGNOSIS — Z01419 Encounter for gynecological examination (general) (routine) without abnormal findings: Secondary | ICD-10-CM | POA: Diagnosis not present

## 2018-10-02 NOTE — Patient Instructions (Signed)
I value your feedback and entrusting us with your care. If you get a [facility] patient survey, I would appreciate you taking the time to let us know about your experience today. Thank you! 

## 2018-10-03 LAB — COMPREHENSIVE METABOLIC PANEL
ALT: 13 IU/L (ref 0–32)
AST: 20 IU/L (ref 0–40)
Albumin/Globulin Ratio: 2 (ref 1.2–2.2)
Albumin: 4.6 g/dL (ref 3.8–4.8)
Alkaline Phosphatase: 57 IU/L (ref 39–117)
BUN/Creatinine Ratio: 16 (ref 12–28)
BUN: 11 mg/dL (ref 8–27)
Bilirubin Total: 0.3 mg/dL (ref 0.0–1.2)
CO2: 25 mmol/L (ref 20–29)
Calcium: 9.4 mg/dL (ref 8.7–10.3)
Chloride: 104 mmol/L (ref 96–106)
Creatinine, Ser: 0.7 mg/dL (ref 0.57–1.00)
GFR calc Af Amer: 105 mL/min/{1.73_m2} (ref >59)
GFR calc non Af Amer: 91 mL/min/{1.73_m2} (ref >59)
Globulin, Total: 2.3 g/dL (ref 1.5–4.5)
Glucose: 82 mg/dL (ref 65–99)
Potassium: 4.7 mmol/L (ref 3.5–5.2)
Sodium: 141 mmol/L (ref 134–144)
Total Protein: 6.9 g/dL (ref 6.0–8.5)

## 2018-10-03 LAB — LIPID PANEL
Chol/HDL Ratio: 3 ratio (ref 0.0–4.4)
Cholesterol, Total: 216 mg/dL — ABNORMAL HIGH (ref 100–199)
HDL: 71 mg/dL (ref >39)
LDL Chol Calc (NIH): 128 mg/dL — ABNORMAL HIGH (ref 0–99)
Triglycerides: 98 mg/dL (ref 0–149)
VLDL Cholesterol Cal: 17 mg/dL (ref 5–40)

## 2018-11-05 ENCOUNTER — Ambulatory Visit
Admission: RE | Admit: 2018-11-05 | Discharge: 2018-11-05 | Disposition: A | Discharge status: Home / Self Care | Payer: 59 | Source: Ambulatory Visit | Attending: Obstetrics and Gynecology | Admitting: Obstetrics and Gynecology

## 2018-11-05 ENCOUNTER — Encounter: Payer: Self-pay | Admitting: Obstetrics and Gynecology

## 2018-11-05 DIAGNOSIS — Z1239 Encounter for other screening for malignant neoplasm of breast: Secondary | ICD-10-CM | POA: Diagnosis present

## 2018-11-05 DIAGNOSIS — Z1231 Encounter for screening mammogram for malignant neoplasm of breast: Secondary | ICD-10-CM | POA: Insufficient documentation

## 2019-02-28 DIAGNOSIS — Z23 Encounter for immunization: Secondary | ICD-10-CM | POA: Diagnosis not present

## 2019-11-07 ENCOUNTER — Encounter: Payer: Self-pay | Admitting: Obstetrics and Gynecology

## 2019-11-07 ENCOUNTER — Ambulatory Visit (INDEPENDENT_AMBULATORY_CARE_PROVIDER_SITE_OTHER): Payer: 59 | Admitting: Obstetrics and Gynecology

## 2019-11-07 ENCOUNTER — Other Ambulatory Visit: Payer: Self-pay

## 2019-11-07 VITALS — BP 120/80 | Ht 62.0 in | Wt 128.0 lb

## 2019-11-07 DIAGNOSIS — Z1231 Encounter for screening mammogram for malignant neoplasm of breast: Secondary | ICD-10-CM

## 2019-11-07 DIAGNOSIS — Z13220 Encounter for screening for lipoid disorders: Secondary | ICD-10-CM | POA: Diagnosis not present

## 2019-11-07 DIAGNOSIS — Z0000 Encounter for general adult medical examination without abnormal findings: Secondary | ICD-10-CM

## 2019-11-07 DIAGNOSIS — Z01419 Encounter for gynecological examination (general) (routine) without abnormal findings: Secondary | ICD-10-CM | POA: Diagnosis not present

## 2019-11-07 NOTE — Patient Instructions (Signed)
I value your feedback and entrusting us with your care. If you get a [facility] patient survey, I would appreciate you taking the time to let us know about your experience today. Thank you! ° °As of January 02, 2019, your lab results will be released to your MyChart immediately, before I even have a chance to see them. Please give me time to review them and contact you if there are any abnormalities. Thank you for your patience.  ° °[facility]: [phone] ° ° ° °

## 2019-11-07 NOTE — Progress Notes (Signed)
Chief Complaint  Patient presents with  . Gynecologic Exam     HPI:      Ms. Haley Cortez is a 64 y.o. G2P1011 who LMP was No LMP recorded. Patient is postmenopausal., presents today for her annual examination. Her menses are absent due to menopause. Pelvic pain none. She does not have PMB.  She does not have vasomotor sx.   Sex activity: single partner, contraception - post menopausal status. She does have vaginal dryness. She uses lubricants with sx relief.  Last Pap: 05/04/16  Results were: no abnormalities /neg HPV DNA.  Hx of STDs: none  Last mammogram: 11/05/18 Results were: normal--routine follow-up in 12 months.  There is no FH of breast cancer. There is no FH of ovarian cancer. The patient does self-breast exams.  Colonoscopy: colonoscopy 11/2017 with diverticulosis in past; repeat due after 10 yrs. Dr. Mechele Collin.   Tobacco use: The patient denies current or previous tobacco use. Alcohol use: social drinker No drug use. Exercise: moderately active  She does get adequate calcium and Vitamin D in her diet.  Had borderline lipids 2018/2019 labs. Due for repeat labs, not fasting today.   Past Medical History:  Diagnosis Date  . Diverticulitis     Past Surgical History:  Procedure Laterality Date  . COLONOSCOPY WITH PROPOFOL N/A 11/30/2017   Procedure: COLONOSCOPY WITH PROPOFOL;  Surgeon: Scot Jun, MD;  Location: [facility] ENDOSCOPY;  Service: Endoscopy;  Laterality: N/A;  . diagnostic colonoscopy  2009   diverticulitis  . DILATION AND CURETTAGE OF UTERUS      Family History  Problem Relation Age of Onset  . Hypertension Mother   . Breast cancer Neg Hx     Social History   Socioeconomic History  . Marital status: Married    Spouse name: Not on file  . Number of children: Not on file  . Years of education: Not on file  . Highest education level: Not on file  Occupational History  . Not on file  Tobacco Use  . Smoking status: Never Smoker  .  Smokeless tobacco: Never Used  Vaping Use  . Vaping Use: Never used  Substance and Sexual Activity  . Alcohol use: Yes    Alcohol/week: 0.0 standard drinks  . Drug use: No  . Sexual activity: Yes    Birth control/protection: Post-menopausal  Other Topics Concern  . Not on file  Social History Narrative  . Not on file   Social Determinants of Health   Financial Resource Strain:   . Difficulty of Paying Living Expenses: Not on file  Food Insecurity:   . Worried About Programme researcher, broadcasting/film/video in the Last Year: Not on file  . Ran Out of Food in the Last Year: Not on file  Transportation Needs:   . Lack of Transportation (Medical): Not on file  . Lack of Transportation (Non-Medical): Not on file  Physical Activity:   . Days of Exercise per Week: Not on file  . Minutes of Exercise per Session: Not on file  Stress:   . Feeling of Stress : Not on file  Social Connections:   . Frequency of Communication with Friends and Family: Not on file  . Frequency of Social Gatherings with Friends and Family: Not on file  . Attends Religious Services: Not on file  . Active Member of Clubs or Organizations: Not on file  . Attends Banker Meetings: Not on file  . Marital Status: Not on file  Intimate Partner Violence:   . Fear of Current or Ex-Partner: Not on file  . Emotionally Abused: Not on file  . Physically Abused: Not on file  . Sexually Abused: Not on file    Current Outpatient Medications on File Prior to Visit  Medication Sig Dispense Refill  . Calcium Carbonate-Vitamin D (CALTRATE 600+D PO) Take by mouth.     No current facility-administered medications on file prior to visit.      ROS:  Review of Systems  Constitutional: Negative for fatigue, fever and unexpected weight change.  Respiratory: Negative for cough, shortness of breath and wheezing.   Cardiovascular: Negative for chest pain, palpitations and leg swelling.  Gastrointestinal: Negative for blood in  stool, constipation, diarrhea, nausea and vomiting.  Endocrine: Negative for cold intolerance, heat intolerance and polyuria.  Genitourinary: Negative for dyspareunia, dysuria, flank pain, frequency, genital sores, hematuria, menstrual problem, pelvic pain, urgency, vaginal bleeding, vaginal discharge and vaginal pain.  Musculoskeletal: Negative for back pain, joint swelling and myalgias.  Skin: Negative for rash.  Neurological: Negative for dizziness, syncope, light-headedness, numbness and headaches.  Hematological: Negative for adenopathy.  Psychiatric/Behavioral: Negative for agitation, confusion, sleep disturbance and suicidal ideas. The patient is not nervous/anxious.      Objective: BP 120/80   Ht 5\' 2"  (1.575 m)   Wt 128 lb (58.1 kg)   BMI 23.41 kg/m    Physical Exam Constitutional:      Appearance: She is well-developed.  Genitourinary:     Vulva, vagina, uterus, right adnexa and left adnexa normal.     No vulval lesion or tenderness noted.     No vaginal discharge, erythema or tenderness.     No cervical motion tenderness or polyp.     Uterus is not enlarged or tender.     No right or left adnexal mass present.     Right adnexa not tender.     Left adnexa not tender.  Neck:     Thyroid: No thyromegaly.  Cardiovascular:     Rate and Rhythm: Normal rate and regular rhythm.     Heart sounds: Normal heart sounds. No murmur heard.   Pulmonary:     Effort: Pulmonary effort is normal.     Breath sounds: Normal breath sounds.  Chest:     Breasts:        Right: No mass, nipple discharge, skin change or tenderness.        Left: No mass, nipple discharge, skin change or tenderness.  Abdominal:     Palpations: Abdomen is soft.     Tenderness: There is no abdominal tenderness. There is no guarding.  Musculoskeletal:        General: Normal range of motion.     Cervical back: Normal range of motion.  Neurological:     General: No focal deficit present.     Mental  Status: She is alert and oriented to person, place, and time.     Cranial Nerves: No cranial nerve deficit.  Skin:    General: Skin is warm and dry.  Psychiatric:        Mood and Affect: Mood normal.        Behavior: Behavior normal.        Thought Content: Thought content normal.        Judgment: Judgment normal.  Vitals reviewed.     Assessment/Plan:  Encounter for annual routine gynecological examination  Encounter for screening mammogram for malignant neoplasm of breast - Plan: MM 3D  SCREEN BREAST BILATERAL; pt to sched mammo  Blood tests for routine general physical examination - Plan: Lipid panel, Comprehensive metabolic panel  Screening cholesterol level - Plan: Lipid panel          GYN counsel breast self exam, mammography screening, menopause, adequate intake of calcium and vitamin D, diet and exercise     F/U  Return in about 1 year (around 11/06/2020).  Aum Caggiano B. Genecis Veley, PA-C 11/07/2019 1:53 PM

## 2019-11-12 ENCOUNTER — Other Ambulatory Visit: Payer: Self-pay

## 2019-11-12 ENCOUNTER — Other Ambulatory Visit: Payer: 59

## 2019-11-12 DIAGNOSIS — Z0000 Encounter for general adult medical examination without abnormal findings: Secondary | ICD-10-CM | POA: Diagnosis not present

## 2019-11-12 DIAGNOSIS — Z13220 Encounter for screening for lipoid disorders: Secondary | ICD-10-CM

## 2019-11-13 LAB — LIPID PANEL
Chol/HDL Ratio: 3 ratio (ref 0.0–4.4)
Cholesterol, Total: 222 mg/dL — ABNORMAL HIGH (ref 100–199)
HDL: 73 mg/dL (ref >39)
LDL Chol Calc (NIH): 136 mg/dL — ABNORMAL HIGH (ref 0–99)
Triglycerides: 76 mg/dL (ref 0–149)
VLDL Cholesterol Cal: 13 mg/dL (ref 5–40)

## 2019-12-10 ENCOUNTER — Ambulatory Visit
Admission: RE | Admit: 2019-12-10 | Discharge: 2019-12-10 | Disposition: A | Discharge status: Home / Self Care | Payer: 59 | Source: Ambulatory Visit | Attending: Obstetrics and Gynecology | Admitting: Obstetrics and Gynecology

## 2019-12-10 ENCOUNTER — Other Ambulatory Visit: Payer: Self-pay

## 2019-12-10 DIAGNOSIS — Z1231 Encounter for screening mammogram for malignant neoplasm of breast: Secondary | ICD-10-CM | POA: Diagnosis not present

## 2020-07-15 DIAGNOSIS — H524 Presbyopia: Secondary | ICD-10-CM | POA: Diagnosis not present

## 2020-11-08 NOTE — Progress Notes (Signed)
Chief Complaint  Patient presents with   Gynecologic Exam    No concerns     HPI:      Ms. Haley Cortez is a 65 y.o. G2P1011 who LMP was No LMP recorded. Patient is postmenopausal., presents today for her annual examination. Her menses are absent due to menopause. Pelvic pain none. She does not have PMB.  She does not have vasomotor sx.   Sex activity: single partner, contraception - post menopausal status. She does have vaginal dryness. She uses lubricants with sx relief.   Last Pap: 05/04/16  Results were: no abnormalities /neg HPV DNA.  Hx of STDs: none   Last mammogram: 12/10/19 Results were: normal--routine follow-up in 12 months.  There is no FH of breast cancer. There is no FH of ovarian cancer. The patient does self-breast exams.   Colonoscopy: colonoscopy 11/2017 with diverticulosis in past; repeat due after 10 yrs. Dr. Mechele Collin.     Tobacco use: The patient denies current or previous tobacco use. Alcohol use: social drinker No drug use. Exercise: moderately active  DEXA: never   She does get adequate calcium and Vitamin D in her diet.  Had borderline lipids 2018-2021 labs. Due for repeat labs   Past Medical History:  Diagnosis Date   Diverticulitis     Past Surgical History:  Procedure Laterality Date   COLONOSCOPY WITH PROPOFOL N/A 11/30/2017   Procedure: COLONOSCOPY WITH PROPOFOL;  Surgeon: Scot Jun, MD;  Location: [facility] ENDOSCOPY;  Service: Endoscopy;  Laterality: N/A;   diagnostic colonoscopy  2009   diverticulitis   DILATION AND CURETTAGE OF UTERUS      Family History  Problem Relation Age of Onset   Hypertension Mother    Breast cancer Neg Hx     Social History   Socioeconomic History   Marital status: Married    Spouse name: Not on file   Number of children: Not on file   Years of education: Not on file   Highest education level: Not on file  Occupational History   Not on file  Tobacco Use   Smoking status: Never   Smokeless  tobacco: Never  Vaping Use   Vaping Use: Never used  Substance and Sexual Activity   Alcohol use: Yes    Alcohol/week: 0.0 standard drinks   Drug use: No   Sexual activity: Yes    Birth control/protection: Post-menopausal  Other Topics Concern   Not on file  Social History Narrative   Not on file   Social Determinants of Health   Financial Resource Strain: Not on file  Food Insecurity: Not on file  Transportation Needs: Not on file  Physical Activity: Not on file  Stress: Not on file  Social Connections: Not on file  Intimate Partner Violence: Not on file    Current Outpatient Medications on File Prior to Visit  Medication Sig Dispense Refill   Calcium Carbonate-Vitamin D (CALTRATE 600+D PO) Take by mouth.     No current facility-administered medications on file prior to visit.      ROS:  Review of Systems  Constitutional:  Negative for fatigue, fever and unexpected weight change.  Respiratory:  Negative for cough, shortness of breath and wheezing.   Cardiovascular:  Negative for chest pain, palpitations and leg swelling.  Gastrointestinal:  Negative for blood in stool, constipation, diarrhea, nausea and vomiting.  Endocrine: Negative for cold intolerance, heat intolerance and polyuria.  Genitourinary:  Negative for dyspareunia, dysuria, flank pain, frequency, genital sores,  hematuria, menstrual problem, pelvic pain, urgency, vaginal bleeding, vaginal discharge and vaginal pain.  Musculoskeletal:  Positive for arthralgias. Negative for back pain, joint swelling and myalgias.  Skin:  Negative for rash.  Neurological:  Negative for dizziness, syncope, light-headedness, numbness and headaches.  Hematological:  Negative for adenopathy.  Psychiatric/Behavioral:  Negative for agitation, confusion, sleep disturbance and suicidal ideas. The patient is not nervous/anxious.     Objective: BP 120/90   Ht 5\' 2"  (1.575 m)   Wt 128 lb (58.1 kg)   BMI 23.41 kg/m    Physical  Exam Constitutional:      Appearance: She is well-developed.  Genitourinary:     Vulva normal.     Right Labia: No rash, tenderness or lesions.    Left Labia: No tenderness, lesions or rash.    No vaginal discharge, erythema or tenderness.      Right Adnexa: not tender and no mass present.    Left Adnexa: not tender and no mass present.    No cervical motion tenderness, friability or polyp.     Uterus is not enlarged or tender.  Breasts:    Right: No mass, nipple discharge, skin change or tenderness.     Left: No mass, nipple discharge, skin change or tenderness.  Neck:     Thyroid: No thyromegaly.  Cardiovascular:     Rate and Rhythm: Normal rate and regular rhythm.     Heart sounds: Normal heart sounds. No murmur heard. Pulmonary:     Effort: Pulmonary effort is normal.     Breath sounds: Normal breath sounds.  Abdominal:     Palpations: Abdomen is soft.     Tenderness: There is no abdominal tenderness. There is no guarding or rebound.  Musculoskeletal:        General: Normal range of motion.     Cervical back: Normal range of motion.  Lymphadenopathy:     Cervical: No cervical adenopathy.  Neurological:     General: No focal deficit present.     Mental Status: She is alert and oriented to person, place, and time.     Cranial Nerves: No cranial nerve deficit.  Skin:    General: Skin is warm and dry.  Psychiatric:        Mood and Affect: Mood normal.        Behavior: Behavior normal.        Thought Content: Thought content normal.        Judgment: Judgment normal.  Vitals reviewed.    Assessment/Plan:  Encounter for annual routine gynecological examination  Cervical cancer screening - Plan: Cytology - PAP  Screening for HPV (human papillomavirus) - Plan: Cytology - PAP  Encounter for screening mammogram for malignant neoplasm of breast - Plan: MM 3D SCREEN BREAST BILATERAL; pt to sched mammo  Elevated lipids - Plan: Lipid panel,   Blood tests for routine  general physical examination - Plan: Comprehensive metabolic panel, Lipid panel,   Screening for osteoporosis - Plan: DG Bone Density          GYN counsel breast self exam, mammography screening, menopause, adequate intake of calcium and vitamin D, diet and exercise     F/U  Return in about 1 year (around 11/09/2021).  Kao Berkheimer B. Soundra Lampley, PA-C 11/09/2020 8:30 AM

## 2020-11-09 ENCOUNTER — Ambulatory Visit (INDEPENDENT_AMBULATORY_CARE_PROVIDER_SITE_OTHER): Payer: BC Managed Care – PPO | Admitting: Obstetrics and Gynecology

## 2020-11-09 ENCOUNTER — Encounter: Payer: Self-pay | Admitting: Obstetrics and Gynecology

## 2020-11-09 ENCOUNTER — Other Ambulatory Visit (HOSPITAL_COMMUNITY)
Admission: RE | Admit: 2020-11-09 | Discharge: 2020-11-09 | Disposition: A | Discharge status: Home / Self Care | Payer: 59 | Source: Ambulatory Visit | Attending: Obstetrics and Gynecology | Admitting: Obstetrics and Gynecology

## 2020-11-09 ENCOUNTER — Other Ambulatory Visit: Payer: Self-pay

## 2020-11-09 ENCOUNTER — Ambulatory Visit: Payer: 59 | Admitting: Obstetrics and Gynecology

## 2020-11-09 VITALS — BP 120/90 | Ht 62.0 in | Wt 128.0 lb

## 2020-11-09 DIAGNOSIS — Z1231 Encounter for screening mammogram for malignant neoplasm of breast: Secondary | ICD-10-CM

## 2020-11-09 DIAGNOSIS — Z0000 Encounter for general adult medical examination without abnormal findings: Secondary | ICD-10-CM | POA: Diagnosis not present

## 2020-11-09 DIAGNOSIS — Z124 Encounter for screening for malignant neoplasm of cervix: Secondary | ICD-10-CM | POA: Insufficient documentation

## 2020-11-09 DIAGNOSIS — Z13820 Encounter for screening for osteoporosis: Secondary | ICD-10-CM

## 2020-11-09 DIAGNOSIS — Z01419 Encounter for gynecological examination (general) (routine) without abnormal findings: Secondary | ICD-10-CM

## 2020-11-09 DIAGNOSIS — Z1151 Encounter for screening for human papillomavirus (HPV): Secondary | ICD-10-CM

## 2020-11-09 DIAGNOSIS — E785 Hyperlipidemia, unspecified: Secondary | ICD-10-CM

## 2020-11-09 NOTE — Patient Instructions (Signed)
I value your feedback and you entrusting us with your care. If you get a [place] patient survey, I would appreciate you taking the time to let us know about your experience today. Thank you!  [facility]: [phone]      

## 2020-11-10 LAB — LIPID PANEL
Chol/HDL Ratio: 2.8 ratio (ref 0.0–4.4)
Cholesterol, Total: 190 mg/dL (ref 100–199)
HDL: 67 mg/dL (ref >39)
LDL Chol Calc (NIH): 111 mg/dL — ABNORMAL HIGH (ref 0–99)
Triglycerides: 67 mg/dL (ref 0–149)
VLDL Cholesterol Cal: 12 mg/dL (ref 5–40)

## 2020-11-10 LAB — COMPREHENSIVE METABOLIC PANEL
ALT: 15 IU/L (ref 0–32)
AST: 19 IU/L (ref 0–40)
Albumin/Globulin Ratio: 2.1 (ref 1.2–2.2)
Albumin: 4.6 g/dL (ref 3.8–4.8)
Alkaline Phosphatase: 53 IU/L (ref 44–121)
BUN/Creatinine Ratio: 11 — ABNORMAL LOW (ref 12–28)
BUN: 8 mg/dL (ref 8–27)
Bilirubin Total: 0.3 mg/dL (ref 0.0–1.2)
CO2: 22 mmol/L (ref 20–29)
Calcium: 9.7 mg/dL (ref 8.7–10.3)
Chloride: 105 mmol/L (ref 96–106)
Creatinine, Ser: 0.7 mg/dL (ref 0.57–1.00)
Globulin, Total: 2.2 g/dL (ref 1.5–4.5)
Glucose: 84 mg/dL (ref 70–99)
Potassium: 4.6 mmol/L (ref 3.5–5.2)
Sodium: 143 mmol/L (ref 134–144)
Total Protein: 6.8 g/dL (ref 6.0–8.5)
eGFR: 95 mL/min/{1.73_m2} (ref >59)

## 2020-11-10 LAB — CYTOLOGY - PAP
Comment: NEGATIVE
Diagnosis: NEGATIVE
High risk HPV: NEGATIVE

## 2020-12-13 ENCOUNTER — Ambulatory Visit
Admission: RE | Admit: 2020-12-13 | Discharge: 2020-12-13 | Disposition: A | Discharge status: Home / Self Care | Payer: BC Managed Care – PPO | Source: Ambulatory Visit | Attending: Obstetrics and Gynecology | Admitting: Obstetrics and Gynecology

## 2020-12-13 ENCOUNTER — Other Ambulatory Visit: Payer: Self-pay

## 2020-12-13 DIAGNOSIS — Z13820 Encounter for screening for osteoporosis: Secondary | ICD-10-CM | POA: Insufficient documentation

## 2020-12-13 DIAGNOSIS — Z1231 Encounter for screening mammogram for malignant neoplasm of breast: Secondary | ICD-10-CM | POA: Insufficient documentation

## 2020-12-13 DIAGNOSIS — Z780 Asymptomatic menopausal state: Secondary | ICD-10-CM | POA: Diagnosis not present

## 2021-12-29 ENCOUNTER — Telehealth: Payer: Self-pay | Admitting: Obstetrics and Gynecology

## 2021-12-29 DIAGNOSIS — Z1231 Encounter for screening mammogram for malignant neoplasm of breast: Secondary | ICD-10-CM

## 2021-12-29 NOTE — Telephone Encounter (Signed)
Order placed, pt can schedule mammo

## 2021-12-29 NOTE — Telephone Encounter (Signed)
Pt is scheduled for her annual exam with [facility] on 02/02/2022.  She would like to go ahead and schedule her mammogram for work purposes.  Can she get an order for her mammogram?

## 2021-12-30 NOTE — Telephone Encounter (Signed)
Called pt, no answer, left detailed msg order had been placed.

## 2022-02-01 NOTE — Progress Notes (Unsigned)
No chief complaint on file.    HPI:      Ms. Haley Cortez is a 67 y.o. G2P1011 who LMP was No LMP recorded. Patient is postmenopausal., presents today for her annual examination. Her menses are absent due to menopause. Pelvic pain none. She does not have PMB.  She does not have vasomotor sx.   Sex activity: single partner, contraception - post menopausal status. She does have vaginal dryness. She uses lubricants with sx relief.   Last Pap: 11/09/20  Results were: no abnormalities /neg HPV DNA.  Hx of STDs: none   Last mammogram: 12/13/20 Results were: normal--routine follow-up in 12 months.  Has appt 1/24 There is no FH of breast cancer. There is no FH of ovarian cancer. The patient does self-breast exams.   Colonoscopy: colonoscopy 11/2017 with diverticulosis in past; repeat due after 10 yrs. Dr. Vira Agar.   DEXA: 11/22 at [facility], normal spine/hip   Tobacco use: The patient denies current or previous tobacco use. Alcohol use: social drinker No drug use. Exercise: moderately active   She does get adequate calcium and Vitamin D in her diet.  Had borderline lipids 2018-2021 labs, improved 10/22. Slightly elevated LDL. Due for repeat labs   Past Medical History:  Diagnosis Date   Diverticulitis     Past Surgical History:  Procedure Laterality Date   COLONOSCOPY WITH PROPOFOL N/A 11/30/2017   Procedure: COLONOSCOPY WITH PROPOFOL;  Surgeon: Manya Silvas, MD;  Location: [facility] ENDOSCOPY;  Service: Endoscopy;  Laterality: N/A;   diagnostic colonoscopy  2009   diverticulitis   DILATION AND CURETTAGE OF UTERUS      Family History  Problem Relation Age of Onset   Hypertension Mother    Breast cancer Neg Hx     Social History   Socioeconomic History   Marital status: Married    Spouse name: Not on file   Number of children: Not on file   Years of education: Not on file   Highest education level: Not on file  Occupational History   Not on file  Tobacco Use   Smoking  status: Never   Smokeless tobacco: Never  Vaping Use   Vaping Use: Never used  Substance and Sexual Activity   Alcohol use: Yes    Alcohol/week: 0.0 standard drinks of alcohol   Drug use: No   Sexual activity: Yes    Birth control/protection: Post-menopausal  Other Topics Concern   Not on file  Social History Narrative   Not on file   Social Determinants of Health   Financial Resource Strain: Not on file  Food Insecurity: Not on file  Transportation Needs: Not on file  Physical Activity: Not on file  Stress: Not on file  Social Connections: Not on file  Intimate Partner Violence: Not on file    Current Outpatient Medications on File Prior to Visit  Medication Sig Dispense Refill   Calcium Carbonate-Vitamin D (CALTRATE 600+D PO) Take by mouth.     No current facility-administered medications on file prior to visit.      ROS:  Review of Systems  Constitutional:  Negative for fatigue, fever and unexpected weight change.  Respiratory:  Negative for cough, shortness of breath and wheezing.   Cardiovascular:  Negative for chest pain, palpitations and leg swelling.  Gastrointestinal:  Negative for blood in stool, constipation, diarrhea, nausea and vomiting.  Endocrine: Negative for cold intolerance, heat intolerance and polyuria.  Genitourinary:  Negative for dyspareunia, dysuria, flank pain, frequency,  genital sores, hematuria, menstrual problem, pelvic pain, urgency, vaginal bleeding, vaginal discharge and vaginal pain.  Musculoskeletal:  Positive for arthralgias. Negative for back pain, joint swelling and myalgias.  Skin:  Negative for rash.  Neurological:  Negative for dizziness, syncope, light-headedness, numbness and headaches.  Hematological:  Negative for adenopathy.  Psychiatric/Behavioral:  Negative for agitation, confusion, sleep disturbance and suicidal ideas. The patient is not nervous/anxious.      Objective: There were no vitals taken for this  visit.   Physical Exam Constitutional:      Appearance: She is well-developed.  Genitourinary:     Vulva normal.     Right Labia: No rash, tenderness or lesions.    Left Labia: No tenderness, lesions or rash.    No vaginal discharge, erythema or tenderness.      Right Adnexa: not tender and no mass present.    Left Adnexa: not tender and no mass present.    No cervical motion tenderness, friability or polyp.     Uterus is not enlarged or tender.  Breasts:    Right: No mass, nipple discharge, skin change or tenderness.     Left: No mass, nipple discharge, skin change or tenderness.  Neck:     Thyroid: No thyromegaly.  Cardiovascular:     Rate and Rhythm: Normal rate and regular rhythm.     Heart sounds: Normal heart sounds. No murmur heard. Pulmonary:     Effort: Pulmonary effort is normal.     Breath sounds: Normal breath sounds.  Abdominal:     Palpations: Abdomen is soft.     Tenderness: There is no abdominal tenderness. There is no guarding or rebound.  Musculoskeletal:        General: Normal range of motion.     Cervical back: Normal range of motion.  Lymphadenopathy:     Cervical: No cervical adenopathy.  Neurological:     General: No focal deficit present.     Mental Status: She is alert and oriented to person, place, and time.     Cranial Nerves: No cranial nerve deficit.  Skin:    General: Skin is warm and dry.  Psychiatric:        Mood and Affect: Mood normal.        Behavior: Behavior normal.        Thought Content: Thought content normal.        Judgment: Judgment normal.  Vitals reviewed.     Assessment/Plan:  Encounter for annual routine gynecological examination  Cervical cancer screening - Plan: Cytology - PAP  Screening for HPV (human papillomavirus) - Plan: Cytology - PAP  Encounter for screening mammogram for malignant neoplasm of breast - Plan: MM 3D SCREEN BREAST BILATERAL; pt to sched mammo  Elevated lipids - Plan: Lipid panel,    Blood tests for routine general physical examination - Plan: Comprehensive metabolic panel, Lipid panel,   Screening for osteoporosis - Plan: DG Bone Density          GYN counsel breast self exam, mammography screening, menopause, adequate intake of calcium and vitamin D, diet and exercise     F/U  No follow-ups on file.  Karry Causer B. Lynlee Stratton, PA-C 02/01/2022 8:44 PM

## 2022-02-02 ENCOUNTER — Encounter: Payer: Self-pay | Admitting: Obstetrics and Gynecology

## 2022-02-02 ENCOUNTER — Ambulatory Visit (INDEPENDENT_AMBULATORY_CARE_PROVIDER_SITE_OTHER): Payer: BC Managed Care – PPO | Admitting: Obstetrics and Gynecology

## 2022-02-02 VITALS — BP 110/70 | Ht 62.0 in | Wt 137.0 lb

## 2022-02-02 DIAGNOSIS — E785 Hyperlipidemia, unspecified: Secondary | ICD-10-CM

## 2022-02-02 DIAGNOSIS — Z1231 Encounter for screening mammogram for malignant neoplasm of breast: Secondary | ICD-10-CM

## 2022-02-02 DIAGNOSIS — Z01419 Encounter for gynecological examination (general) (routine) without abnormal findings: Secondary | ICD-10-CM

## 2022-02-02 DIAGNOSIS — Z0000 Encounter for general adult medical examination without abnormal findings: Secondary | ICD-10-CM

## 2022-02-02 NOTE — Patient Instructions (Signed)
I value your feedback and you entrusting us with your care. If you get a [place] patient survey, I would appreciate you taking the time to let us know about your experience today. Thank you! ? ? ?

## 2022-02-17 ENCOUNTER — Other Ambulatory Visit: Payer: BC Managed Care – PPO

## 2022-02-17 ENCOUNTER — Ambulatory Visit
Admission: RE | Admit: 2022-02-17 | Discharge: 2022-02-17 | Disposition: A | Discharge status: Home / Self Care | Payer: BC Managed Care – PPO | Source: Ambulatory Visit | Attending: Obstetrics and Gynecology | Admitting: Obstetrics and Gynecology

## 2022-02-17 DIAGNOSIS — Z0000 Encounter for general adult medical examination without abnormal findings: Secondary | ICD-10-CM

## 2022-02-17 DIAGNOSIS — Z1231 Encounter for screening mammogram for malignant neoplasm of breast: Secondary | ICD-10-CM | POA: Insufficient documentation

## 2022-02-17 DIAGNOSIS — E785 Hyperlipidemia, unspecified: Secondary | ICD-10-CM

## 2022-02-18 LAB — LIPID PANEL
Chol/HDL Ratio: 3.2 ratio (ref 0.0–4.4)
Cholesterol, Total: 227 mg/dL — ABNORMAL HIGH (ref 100–199)
HDL: 72 mg/dL (ref >39)
LDL Chol Calc (NIH): 142 mg/dL — ABNORMAL HIGH (ref 0–99)
Triglycerides: 76 mg/dL (ref 0–149)
VLDL Cholesterol Cal: 13 mg/dL (ref 5–40)

## 2022-02-21 ENCOUNTER — Other Ambulatory Visit: Payer: Self-pay | Admitting: Obstetrics and Gynecology

## 2022-02-21 ENCOUNTER — Telehealth: Payer: Self-pay | Admitting: Obstetrics and Gynecology

## 2022-02-21 DIAGNOSIS — Z131 Encounter for screening for diabetes mellitus: Secondary | ICD-10-CM

## 2022-02-21 DIAGNOSIS — Z0000 Encounter for general adult medical examination without abnormal findings: Secondary | ICD-10-CM

## 2022-02-21 NOTE — Telephone Encounter (Signed)
I called patient to get scheduled for a fasting labs ordered by [facility]. Asked patient to call back.

## 2022-02-21 NOTE — Telephone Encounter (Signed)
Patient complete 1/26 for labs

## 2022-02-23 ENCOUNTER — Telehealth: Payer: Self-pay | Admitting: Obstetrics and Gynecology

## 2022-02-23 NOTE — Telephone Encounter (Signed)
Called patient so we can get her scheduled for fasting lab per [facility]. The sooner we get her in the paperwork can be done that she sent in. Asked to call back.

## 2022-02-28 ENCOUNTER — Other Ambulatory Visit: Payer: BC Managed Care – PPO

## 2022-02-28 DIAGNOSIS — Z131 Encounter for screening for diabetes mellitus: Secondary | ICD-10-CM

## 2022-02-28 DIAGNOSIS — Z0000 Encounter for general adult medical examination without abnormal findings: Secondary | ICD-10-CM | POA: Diagnosis not present

## 2022-03-01 LAB — COMPREHENSIVE METABOLIC PANEL
ALT: 12 IU/L (ref 0–32)
AST: 17 IU/L (ref 0–40)
Albumin/Globulin Ratio: 2.1 (ref 1.2–2.2)
Albumin: 4.4 g/dL (ref 3.9–4.9)
Alkaline Phosphatase: 69 IU/L (ref 44–121)
BUN/Creatinine Ratio: 21 (ref 12–28)
BUN: 15 mg/dL (ref 8–27)
Bilirubin Total: 0.5 mg/dL (ref 0.0–1.2)
CO2: 25 mmol/L (ref 20–29)
Calcium: 9.5 mg/dL (ref 8.7–10.3)
Chloride: 104 mmol/L (ref 96–106)
Creatinine, Ser: 0.71 mg/dL (ref 0.57–1.00)
Globulin, Total: 2.1 g/dL (ref 1.5–4.5)
Glucose: 86 mg/dL (ref 70–99)
Potassium: 4.5 mmol/L (ref 3.5–5.2)
Sodium: 143 mmol/L (ref 134–144)
Total Protein: 6.5 g/dL (ref 6.0–8.5)
eGFR: 93 mL/min/{1.73_m2} (ref >59)

## 2022-03-01 LAB — HEMOGLOBIN A1C
Est. average glucose Bld gHb Est-mCnc: 108 mg/dL
Hgb A1c MFr Bld: 5.4 % (ref 4.8–5.6)

## 2022-03-01 NOTE — Telephone Encounter (Signed)
Patient seen for labs on 2/6

## 2023-02-28 ENCOUNTER — Other Ambulatory Visit: Payer: Self-pay | Admitting: Obstetrics and Gynecology

## 2023-02-28 DIAGNOSIS — Z1231 Encounter for screening mammogram for malignant neoplasm of breast: Secondary | ICD-10-CM

## 2023-03-08 ENCOUNTER — Ambulatory Visit
Admission: RE | Admit: 2023-03-08 | Discharge: 2023-03-08 | Disposition: A | Discharge status: Home / Self Care | Payer: BC Managed Care – PPO | Source: Ambulatory Visit | Attending: Obstetrics and Gynecology | Admitting: Obstetrics and Gynecology

## 2023-03-08 DIAGNOSIS — Z1231 Encounter for screening mammogram for malignant neoplasm of breast: Secondary | ICD-10-CM | POA: Diagnosis present

## 2023-03-12 ENCOUNTER — Encounter: Payer: Self-pay | Admitting: Obstetrics and Gynecology

## 2023-03-15 ENCOUNTER — Telehealth: Payer: Self-pay

## 2023-03-15 NOTE — Telephone Encounter (Signed)
At her age, pt needs evaluation (and she needs to get PCP since we're not PCP). She can call to see if she can get appt soon or go to [facility].

## 2023-03-15 NOTE — Telephone Encounter (Signed)
 Called pt, no answer, LVMTRC.

## 2023-03-15 NOTE — Telephone Encounter (Signed)
 Pt aware.

## 2023-03-15 NOTE — Telephone Encounter (Signed)
[employer] VOICEMAIL: Patient states she pulled a muscle in her back. Inquiring if Helmut Muster will send a rx for muscle relaxer. She advised she does not have a PCP.

## 2023-03-25 NOTE — Progress Notes (Unsigned)
 No chief complaint on file.    HPI:      Ms. Haley Cortez is a 68 y.o. G2P1011 who LMP was No LMP recorded. Patient is postmenopausal., presents today for her annual examination. Her menses are absent due to menopause. She does not have PMB.  She does not have vasomotor sx.   Sex activity: single partner, contraception - post menopausal status. She does have vaginal dryness. She uses lubricants with sx relief.   Last Pap: 11/09/20  Results were: no abnormalities /neg HPV DNA.  Hx of STDs: none   Last mammogram: 03/08/23 Results were: normal--routine follow-up in 12 months.   There is no FH of breast cancer. There is no FH of ovarian cancer. The patient does self-breast exams.   Colonoscopy: colonoscopy 11/2017 with diverticulosis in past; repeat due after 10 yrs. Dr. Mechele Collin.   DEXA: 11/22 at [facility], normal spine/hip   Tobacco use: The patient denies current or previous tobacco use. Alcohol use: social drinker No drug use. Exercise: occas active   She does get adequate calcium and Vitamin D in her diet.  Had borderline lipids 2018-2021 labs, improved 10/22. Slightly elevated LDL. Due for repeat labs   Past Medical History:  Diagnosis Date   Diverticulitis     Past Surgical History:  Procedure Laterality Date   COLONOSCOPY WITH PROPOFOL N/A 11/30/2017   Procedure: COLONOSCOPY WITH PROPOFOL;  Surgeon: Scot Jun, MD;  Location: [facility] ENDOSCOPY;  Service: Endoscopy;  Laterality: N/A;   diagnostic colonoscopy  2009   diverticulitis   DILATION AND CURETTAGE OF UTERUS      Family History  Problem Relation Age of Onset   Hypertension Mother    Breast cancer Neg Hx     Social History   Socioeconomic History   Marital status: Married    Spouse name: Not on file   Number of children: Not on file   Years of education: Not on file   Highest education level: Not on file  Occupational History   Not on file  Tobacco Use   Smoking status: Never   Smokeless tobacco:  Never  Vaping Use   Vaping status: Never Used  Substance and Sexual Activity   Alcohol use: Yes    Alcohol/week: 0.0 standard drinks of alcohol   Drug use: No   Sexual activity: Yes    Birth control/protection: Post-menopausal  Other Topics Concern   Not on file  Social History Narrative   Not on file   Social Drivers of Health   Financial Resource Strain: Not on file  Food Insecurity: Not on file  Transportation Needs: Not on file  Physical Activity: Not on file  Stress: Not on file  Social Connections: Unknown (09/11/2017)   Received from [facility], [facility]   Social Connection and Isolation Panel [NHANES]    Frequency of Communication with Friends and Family: Not on file    Frequency of Social Gatherings with Friends and Family: Not on file    Attends Religious Services: Not on file    Active Member of Clubs or Organizations: Not on file    Attends Banker Meetings: Not on file    Marital Status: Married  Intimate Partner Violence: Not on file    Current Outpatient Medications on File Prior to Visit  Medication Sig Dispense Refill   Calcium Carbonate-Vitamin D (CALTRATE 600+D PO) Take by mouth.     No current facility-administered medications on file prior  to visit.      ROS:  Review of Systems  Constitutional:  Negative for fatigue, fever and unexpected weight change.  Respiratory:  Negative for cough, shortness of breath and wheezing.   Cardiovascular:  Negative for chest pain, palpitations and leg swelling.  Gastrointestinal:  Negative for blood in stool, constipation, diarrhea, nausea and vomiting.  Endocrine: Negative for cold intolerance, heat intolerance and polyuria.  Genitourinary:  Negative for dyspareunia, dysuria, flank pain, frequency, genital sores, hematuria, menstrual problem, pelvic pain, urgency, vaginal bleeding, vaginal discharge and vaginal pain.  Musculoskeletal:  Negative for arthralgias,  back pain, joint swelling and myalgias.  Skin:  Negative for rash.  Neurological:  Negative for dizziness, syncope, light-headedness, numbness and headaches.  Hematological:  Negative for adenopathy.  Psychiatric/Behavioral:  Negative for agitation, confusion, sleep disturbance and suicidal ideas. The patient is not nervous/anxious.      Objective: There were no vitals taken for this visit.   Physical Exam Constitutional:      Appearance: She is well-developed.  Genitourinary:     Vulva normal.     Right Labia: No rash, tenderness or lesions.    Left Labia: No tenderness, lesions or rash.    No vaginal discharge, erythema or tenderness.      Right Adnexa: not tender and no mass present.    Left Adnexa: not tender and no mass present.    No cervical motion tenderness, friability or polyp.     Uterus is not enlarged or tender.  Breasts:    Right: No mass, nipple discharge, skin change or tenderness.     Left: No mass, nipple discharge, skin change or tenderness.  Neck:     Thyroid: No thyromegaly.  Cardiovascular:     Rate and Rhythm: Normal rate and regular rhythm.     Heart sounds: Normal heart sounds. No murmur heard. Pulmonary:     Effort: Pulmonary effort is normal.     Breath sounds: Normal breath sounds.  Abdominal:     Palpations: Abdomen is soft.     Tenderness: There is no abdominal tenderness. There is no guarding or rebound.  Musculoskeletal:        General: Normal range of motion.     Cervical back: Normal range of motion.  Lymphadenopathy:     Cervical: No cervical adenopathy.  Neurological:     General: No focal deficit present.     Mental Status: She is alert and oriented to person, place, and time.     Cranial Nerves: No cranial nerve deficit.  Skin:    General: Skin is warm and dry.  Psychiatric:        Mood and Affect: Mood normal.        Behavior: Behavior normal.        Thought Content: Thought content normal.        Judgment: Judgment normal.   Vitals reviewed.     Assessment/Plan:  Encounter for annual routine gynecological examination  Encounter for screening mammogram for malignant neoplasm of breast; pt current on mammo  Elevated lipids - Plan: Lipid panel; pt to schedule appt  Blood tests for routine general physical examination - Plan: Lipid panel          GYN counsel breast self exam, mammography screening, menopause, adequate intake of calcium and vitamin D, diet and exercise     F/U  No follow-ups on file.  Iyad Deroo B. Christophor Eick, PA-C 03/25/2023 10:13 PM

## 2023-03-27 ENCOUNTER — Ambulatory Visit (INDEPENDENT_AMBULATORY_CARE_PROVIDER_SITE_OTHER): Payer: Self-pay | Admitting: Obstetrics and Gynecology

## 2023-03-27 ENCOUNTER — Encounter: Payer: Self-pay | Admitting: Obstetrics and Gynecology

## 2023-03-27 VITALS — BP 104/70 | Ht 62.0 in | Wt 109.0 lb

## 2023-03-27 DIAGNOSIS — Z0000 Encounter for general adult medical examination without abnormal findings: Secondary | ICD-10-CM

## 2023-03-27 DIAGNOSIS — Z1231 Encounter for screening mammogram for malignant neoplasm of breast: Secondary | ICD-10-CM

## 2023-03-27 DIAGNOSIS — Z13820 Encounter for screening for osteoporosis: Secondary | ICD-10-CM

## 2023-03-27 DIAGNOSIS — E785 Hyperlipidemia, unspecified: Secondary | ICD-10-CM

## 2023-03-27 DIAGNOSIS — Z01419 Encounter for gynecological examination (general) (routine) without abnormal findings: Secondary | ICD-10-CM

## 2023-03-27 NOTE — Patient Instructions (Addendum)
 I value your feedback and you entrusting [place] with your care. If you get a [facility] patient survey, I would appreciate you taking the time to let us know about your experience today. Thank you!  [facility] ([place])--[phone]

## 2023-03-28 ENCOUNTER — Encounter: Payer: Self-pay | Admitting: Obstetrics and Gynecology

## 2023-03-28 LAB — CBC WITH DIFFERENTIAL/PLATELET
Basophils Absolute: 0.1 10*3/uL (ref 0.0–0.2)
Basos: 1 %
EOS (ABSOLUTE): 0.1 10*3/uL (ref 0.0–0.4)
Eos: 1 %
Hematocrit: 45 % (ref 34.0–46.6)
Hemoglobin: 14.8 g/dL (ref 11.1–15.9)
Immature Grans (Abs): 0 10*3/uL (ref 0.0–0.1)
Immature Granulocytes: 0 %
Lymphocytes Absolute: 1.2 10*3/uL (ref 0.7–3.1)
Lymphs: 23 %
MCH: 30.7 pg (ref 26.6–33.0)
MCHC: 32.9 g/dL (ref 31.5–35.7)
MCV: 93 fL (ref 79–97)
Monocytes Absolute: 0.5 10*3/uL (ref 0.1–0.9)
Monocytes: 8 %
Neutrophils Absolute: 3.7 10*3/uL (ref 1.4–7.0)
Neutrophils: 67 %
Platelets: 314 10*3/uL (ref 150–450)
RBC: 4.82 x10E6/uL (ref 3.77–5.28)
RDW: 12.5 % (ref 11.7–15.4)
WBC: 5.5 10*3/uL (ref 3.4–10.8)

## 2023-03-28 LAB — COMPREHENSIVE METABOLIC PANEL
ALT: 19 IU/L (ref 0–32)
AST: 20 IU/L (ref 0–40)
Albumin: 4.3 g/dL (ref 3.9–4.9)
Alkaline Phosphatase: 51 IU/L (ref 44–121)
BUN/Creatinine Ratio: 18 (ref 12–28)
BUN: 13 mg/dL (ref 8–27)
Bilirubin Total: <0.2 mg/dL (ref 0.0–1.2)
CO2: 25 mmol/L (ref 20–29)
Calcium: 9.7 mg/dL (ref 8.7–10.3)
Chloride: 105 mmol/L (ref 96–106)
Creatinine, Ser: 0.74 mg/dL (ref 0.57–1.00)
Globulin, Total: 2.3 g/dL (ref 1.5–4.5)
Glucose: 98 mg/dL (ref 70–99)
Potassium: 4.7 mmol/L (ref 3.5–5.2)
Sodium: 145 mmol/L — ABNORMAL HIGH (ref 134–144)
Total Protein: 6.6 g/dL (ref 6.0–8.5)
eGFR: 88 mL/min/{1.73_m2} (ref >59)

## 2023-03-28 LAB — LIPID PANEL
Chol/HDL Ratio: 2.6 ratio (ref 0.0–4.4)
Cholesterol, Total: 193 mg/dL (ref 100–199)
HDL: 73 mg/dL
LDL Chol Calc (NIH): 101 mg/dL — ABNORMAL HIGH (ref 0–99)
Triglycerides: 107 mg/dL (ref 0–149)
VLDL Cholesterol Cal: 19 mg/dL (ref 5–40)

## 2023-05-02 ENCOUNTER — Ambulatory Visit
Admission: RE | Admit: 2023-05-02 | Discharge: 2023-05-02 | Disposition: A | Discharge status: Home / Self Care | Source: Ambulatory Visit | Attending: Obstetrics and Gynecology | Admitting: Obstetrics and Gynecology

## 2023-05-02 DIAGNOSIS — Z13820 Encounter for screening for osteoporosis: Secondary | ICD-10-CM | POA: Insufficient documentation

## 2023-05-03 ENCOUNTER — Encounter: Payer: Self-pay | Admitting: Obstetrics and Gynecology

## 2023-11-17 IMAGING — MG MM DIGITAL SCREENING BILAT W/ TOMO AND CAD
6 of 10 series · 6 of 30 positions shown · non-contrast
Comparison: Previous exam(s).

CLINICAL DATA: Screening.

EXAM:
DIGITAL SCREENING BILATERAL MAMMOGRAM WITH TOMOSYNTHESIS AND CAD
TECHNIQUE: Bilateral screening digital craniocaudal and mediolateral oblique
mammograms were obtained. Bilateral screening digital breast
tomosynthesis was performed. The images were evaluated with
computer-aided detection.

[R CC synth-2D]
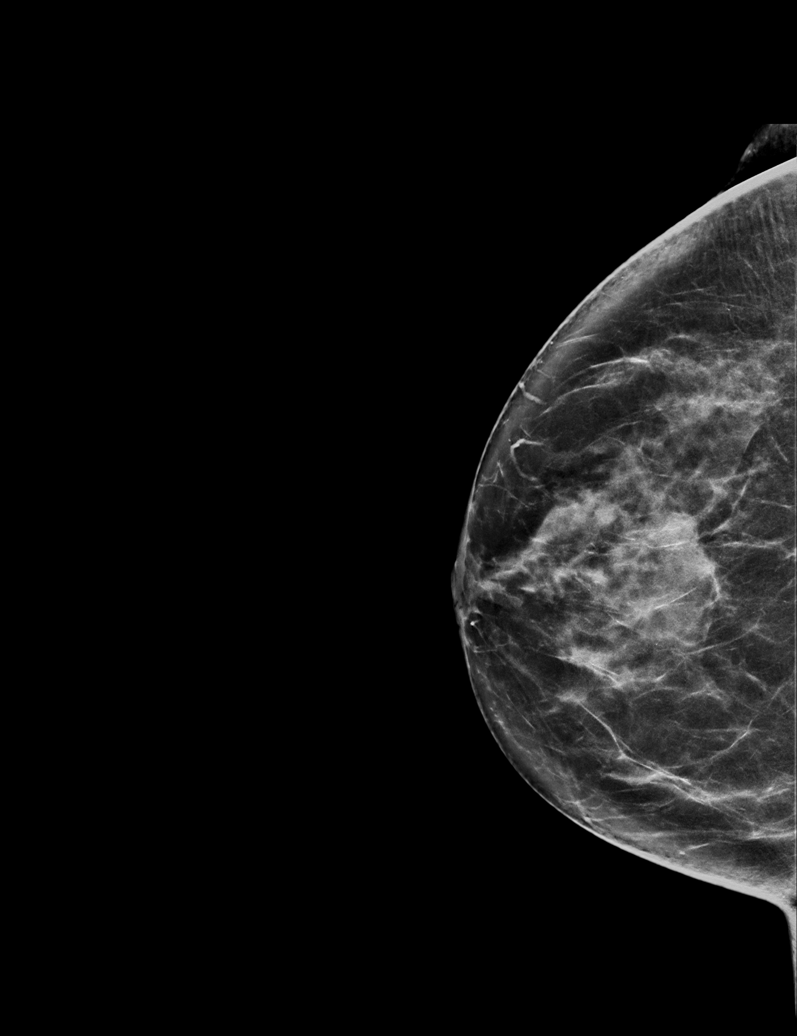

[L CC synth-2D]
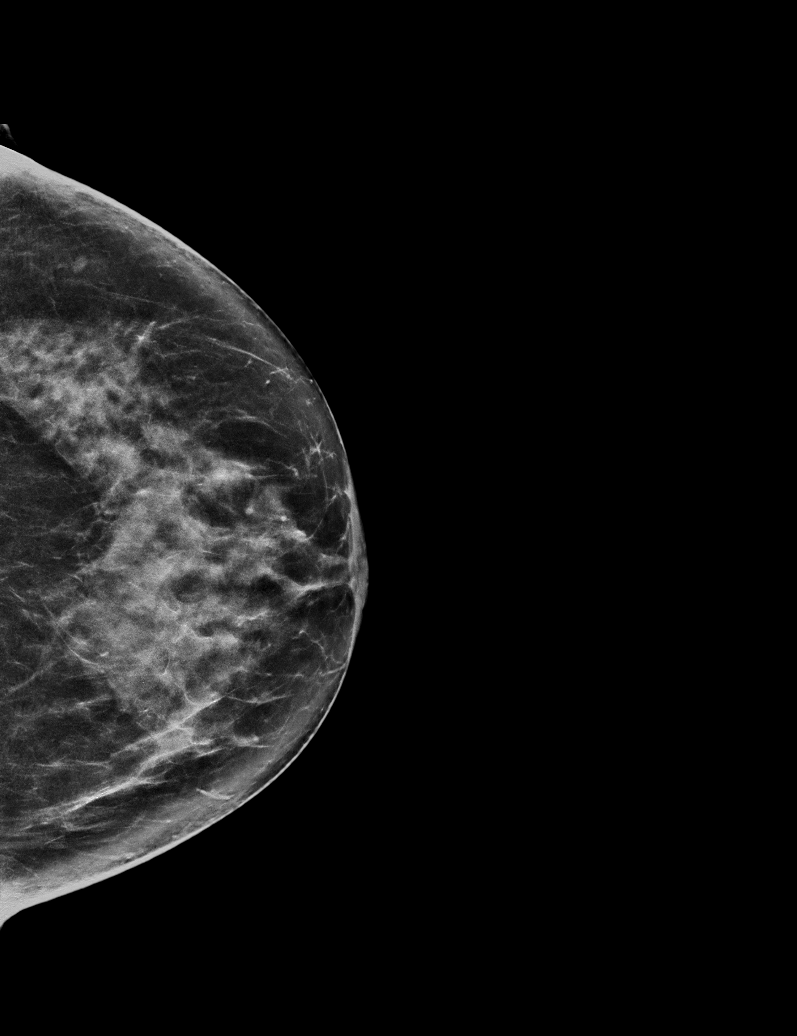

[L MLO synth-2D]
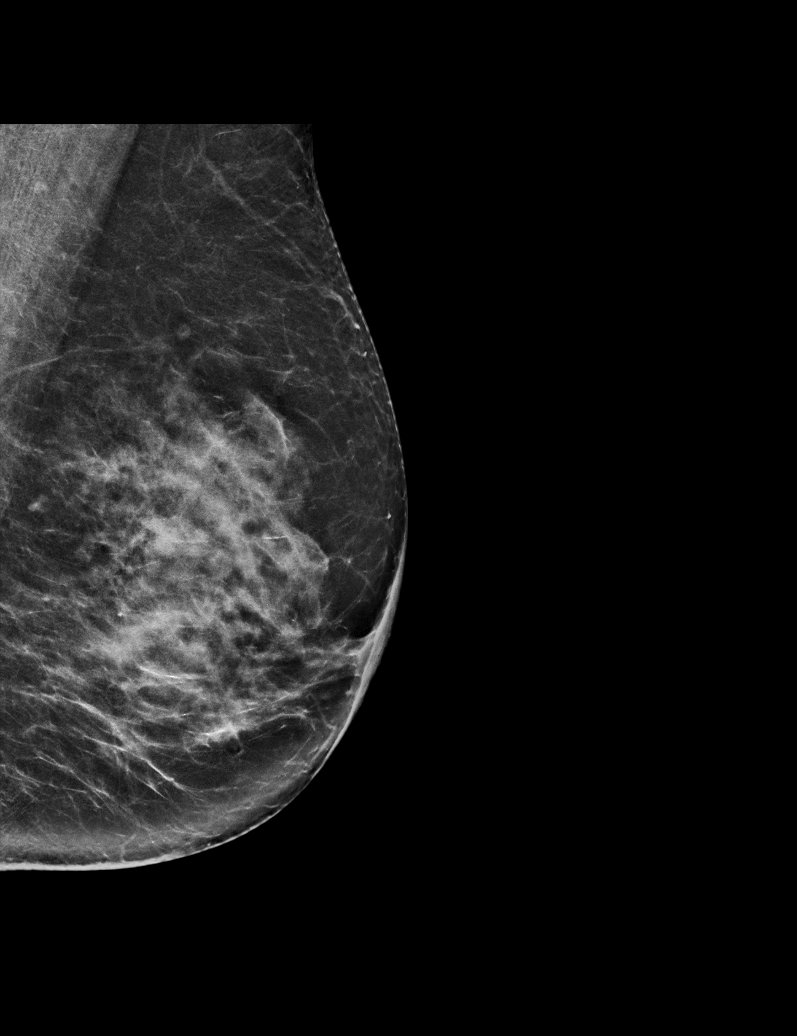

[R MLO synth-2D (1 of 2)]
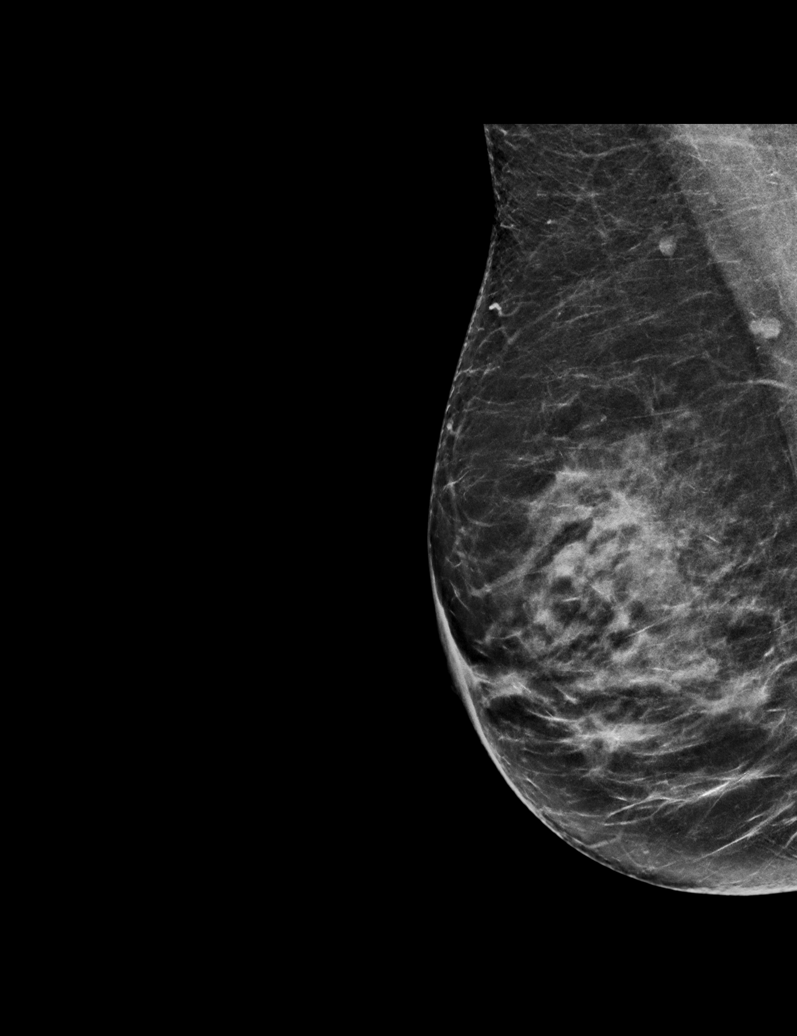

[R MLO synth-2D (2 of 2)]
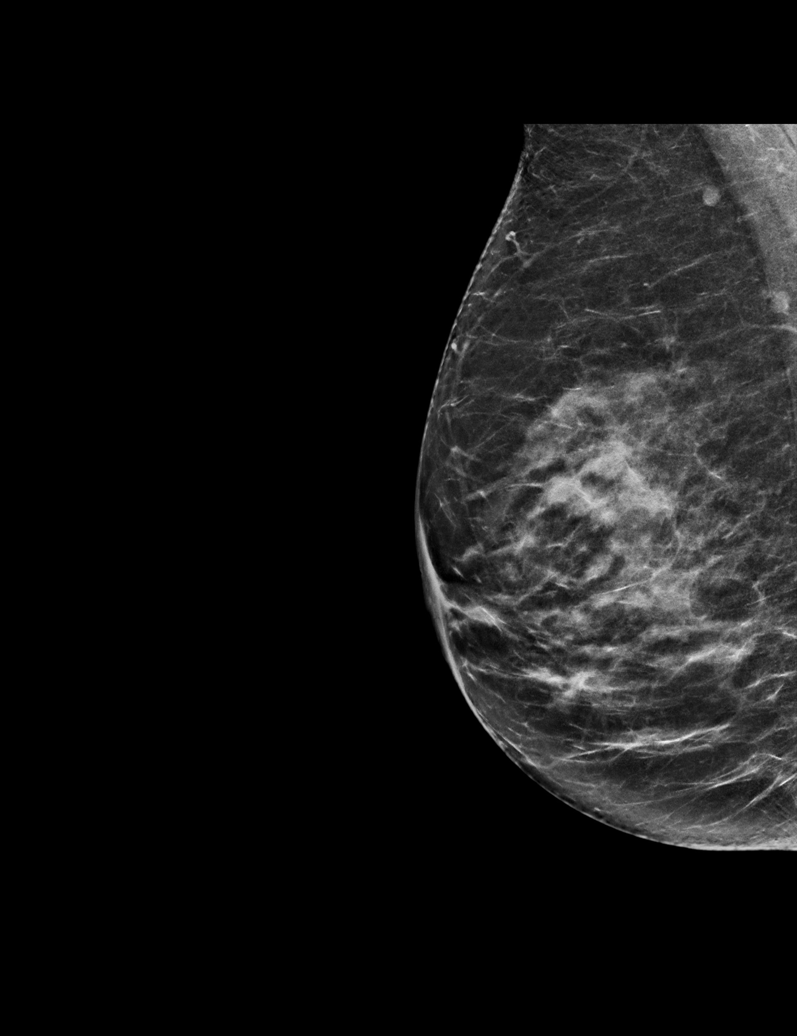

[R MLO tomo · tomo slice 32/63.0]
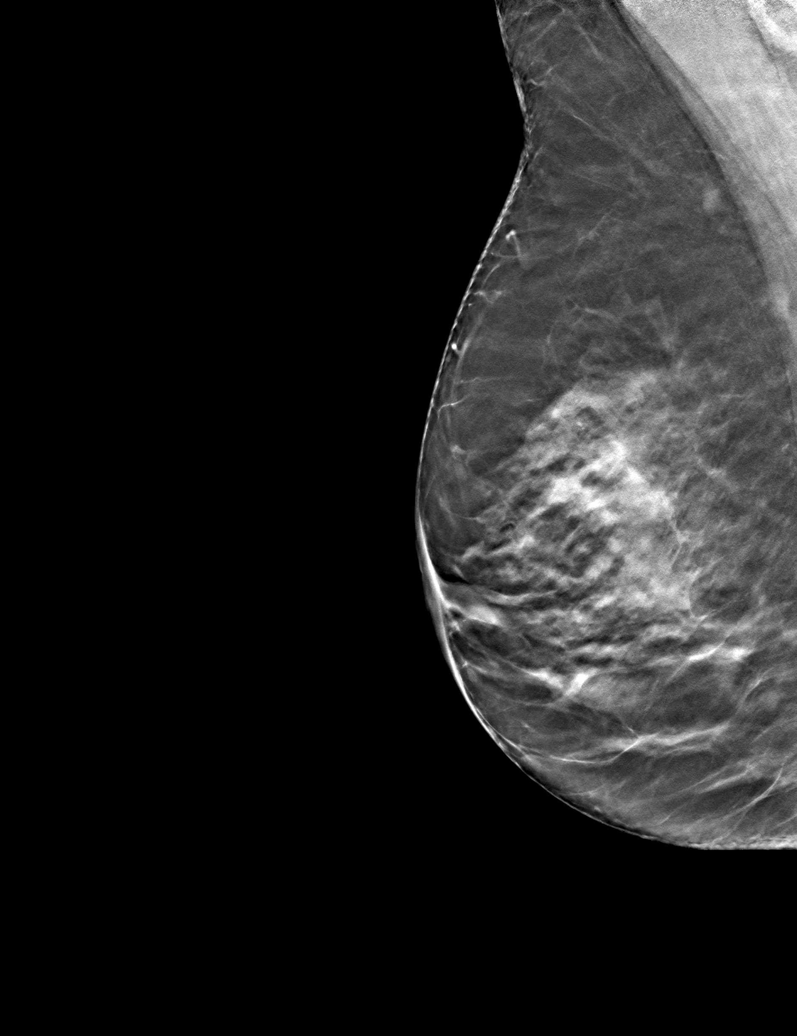

[6 of 30 positions shown; findings below may reference images not displayed]

ACR Breast Density Category c: The breast tissue is heterogeneously
dense, which may obscure small masses.
FINDINGS: There are no findings suspicious for malignancy.
IMPRESSION: No mammographic evidence of malignancy. A result letter of this
screening mammogram will be mailed directly to the patient.

RECOMMENDATION:
Screening mammogram in one year. (Code:Q3-W-BC3)

BI-RADS CATEGORY  1: Negative.
